# Patient Record
Sex: Male | Born: 1985 | Hispanic: Yes | Marital: Married | Smoking: Never smoker
Health system: Southern US, Community
[De-identification: ages and names within clinical notes are randomized; demographics above are authoritative.]

## PROBLEM LIST (undated history)

## (undated) DIAGNOSIS — K219 Gastro-esophageal reflux disease without esophagitis: Secondary | ICD-10-CM

## (undated) DIAGNOSIS — J45909 Unspecified asthma, uncomplicated: Secondary | ICD-10-CM

## (undated) DIAGNOSIS — G4733 Obstructive sleep apnea (adult) (pediatric): Secondary | ICD-10-CM

## (undated) DIAGNOSIS — T7840XA Allergy, unspecified, initial encounter: Secondary | ICD-10-CM

## (undated) DIAGNOSIS — E669 Obesity, unspecified: Secondary | ICD-10-CM

## (undated) HISTORY — DX: Obesity, unspecified: E66.9

## (undated) HISTORY — DX: Unspecified asthma, uncomplicated: J45.909

## (undated) HISTORY — DX: Obstructive sleep apnea (adult) (pediatric): G47.33

## (undated) HISTORY — DX: Gastro-esophageal reflux disease without esophagitis: K21.9

## (undated) HISTORY — DX: Allergy, unspecified, initial encounter: T78.40XA

---

## 2008-07-20 HISTORY — PX: KNEE ARTHROSCOPY: SHX127

## 2016-04-27 ENCOUNTER — Encounter: Payer: Self-pay | Admitting: Family Medicine

## 2016-04-27 ENCOUNTER — Ambulatory Visit (INDEPENDENT_AMBULATORY_CARE_PROVIDER_SITE_OTHER): Payer: 59 | Admitting: Family Medicine

## 2016-04-27 VITALS — BP 124/88 | HR 83 | Temp 98.3°F | Resp 16 | Ht 63.0 in | Wt 177.4 lb

## 2016-04-27 DIAGNOSIS — Z889 Allergy status to unspecified drugs, medicaments and biological substances status: Secondary | ICD-10-CM

## 2016-04-27 DIAGNOSIS — R0683 Snoring: Secondary | ICD-10-CM

## 2016-04-27 DIAGNOSIS — J329 Chronic sinusitis, unspecified: Secondary | ICD-10-CM | POA: Diagnosis not present

## 2016-04-27 DIAGNOSIS — E6609 Other obesity due to excess calories: Secondary | ICD-10-CM | POA: Diagnosis not present

## 2016-04-27 DIAGNOSIS — E669 Obesity, unspecified: Secondary | ICD-10-CM

## 2016-04-27 DIAGNOSIS — Z6831 Body mass index (BMI) 31.0-31.9, adult: Secondary | ICD-10-CM

## 2016-04-27 HISTORY — DX: Obesity, unspecified: E66.9

## 2016-04-27 NOTE — Progress Notes (Signed)
Pre visit review using our clinic review tool, if applicable. No additional management support is needed unless otherwise documented below in the visit note. 

## 2016-04-27 NOTE — Progress Notes (Signed)
Chief Complaint  Patient presents with  . Establish Care    Pt new to establish care. Has environmental allergies (wants to discuss allergy referral), GERD, frequent sinus infections (nosebleeds 2 x weekly). Has been told that he stops breathing while he sleeps.      New Patient Visit SUBJECTIVE: HPI: Gavin HarmanJuan Green is an 30 y.o.male who is being seen for establishing care.  Concerns: Hx of asthma, allergies, interested in referral to allergist for testing; he takes Allegra and uses Flonase daily.  He reports frequent sinus infection. In the past year, he has had 3, 2 of which required antibiotics. He does not have a hx of recurrent strep throat infections. He states that his tonsils are large and he is concerned they need to be taken out.  He is a known snorer. His mother noticed that he stops breathing in his sleep. He would like to be further evaluated.  Nosebleeds  Hx of recurrent nosebleeds. Gets them when he is poorly hydrated or consumes alcohol. He does not routinely pick his nose or have hx of bleeding disorder. He does   Allergies  Allergen Reactions  . Cephalosporins Rash    Past Medical History:  Diagnosis Date  . Allergy   . Asthma   . GERD (gastroesophageal reflux disease)   . Obesity 04/27/2016   Past Surgical History:  Procedure Laterality Date  . KNEE ARTHROSCOPY Left 2010   Social History   Social History  . Marital status: Married   Social History Main Topics  . Smoking status: Never Smoker  . Smokeless tobacco: Never Used  . Alcohol use 1.2 oz/week    2 Cans of beer per week  . Drug use: No  . Sexual activity: Not on file   Other Topics Concern  . Not on file   Social History Narrative  . No narrative on file   Family History  Problem Relation Age of Onset  . Hyperlipidemia Father   . Heart disease Father   . Hypertension Father   . Hyperlipidemia Maternal Grandmother   . Hyperlipidemia Maternal Grandfather   . Heart disease Maternal  Grandfather   . Diabetes Maternal Grandfather   . Hyperlipidemia Paternal Grandmother   . Stroke Paternal Grandmother   . Diabetes Paternal Grandmother   . Hyperlipidemia Paternal Grandfather   . Heart disease Paternal Grandfather   . Pulmonary embolism Paternal Grandfather   . Diabetes Paternal Grandfather      Current Outpatient Prescriptions:  .  fexofenadine (ALLEGRA) 180 MG tablet, Take 180 mg by mouth daily., Disp: , Rfl:  .  fluticasone (FLONASE) 50 MCG/ACT nasal spray, Place 2 sprays into both nostrils daily., Disp: , Rfl:  .  Naproxen Sodium (ALEVE) 220 MG CAPS, Take 0-2 capsules by mouth once a week., Disp: , Rfl:  .  ranitidine (ZANTAC) 75 MG tablet, Take 75 mg by mouth daily as needed for heartburn., Disp: , Rfl:   ROS HEENT: +nosebleeds  Respiratory: Denies dyspnea   OBJECTIVE: BP 124/88 (BP Location: Left Arm, Cuff Size: Normal)   Pulse 83   Temp 98.3 F (36.8 C) (Oral)   Resp 16   Ht 5\' 3"  (1.6 m)   Wt 177 lb 6.4 oz (80.5 kg)   SpO2 98% Comment: room air  BMI 31.42 kg/m   Constitutional: -  VS reviewed -  Well developed, well nourished, appears stated age -  No apparent distress  Psychiatric: -  Oriented to person, place, and time -  Memory intact -  Affect and mood normal -  Fluent conversation, good eye contact -  Judgment and insight age appropriate  Eye: -  Conjunctivae clear, no discharge -  Pupils symmetric, round, reactive to light  ENMT: -  Ears are patent b/l without erythema or discharge. TM's are shiny and clear b/l without evidence of effusion or infection. -  Oral mucosa without lesions, tongue and uvula midline    Tonsils not enlarged, no erythema, no exudate, trachea midline    Pharynx moist, no lesions, no erythema  Neck: -  No gross swelling, no palpable masses -  Thyroid midline, not enlarged, mobile, no palpable masses  Cardiovascular: -  RRR, no murmurs -  No LE edema  Respiratory: -  Normal respiratory effort, no accessory muscle  use, no retraction -  Breath sounds equal, no wheezes, no ronchi, no crackles  Musculoskeletal: -  No clubbing, no cyanosis -  Gait normal  Skin: -  No significant lesion on inspection -  Warm and dry to palpation   ASSESSMENT/PLAN: Snoring - Plan: Ambulatory referral to Pulmonology  H/O seasonal allergies - Plan: Ambulatory referral to Allergy  Recurrent sinusitis - Plan: Ambulatory referral to ENT  Class 1 obesity due to excess calories without serious comorbidity with body mass index (BMI) of 31.0 to 31.9 in adult - Plan: Comprehensive metabolic panel, Lipid panel  Orders as above. Allergist for allergy testing. ENT for discussion of tonsil removal. I did discuss the [place] criteria with the patient, whose parents are also in the medical field, which he had heard of and stated that the ENT provider may not be willing to do surgery. He can also address nosebleeds though, so it won't be a total waste if surgery is not pursued. May also decide to do CT of sinuses. Ointments and humidifier for nosebleeds. Aim towards same side eye and away from septum with nasal spray. Patient should return in 1 year barring an abnormal lab for a CPE. The patient voiced understanding and agreement to the plan.   Jilda Roche [place], DO 04/27/16  4:25 PM

## 2016-04-27 NOTE — Patient Instructions (Signed)
Nosebleeds: Use an air humidifier. Try to use Vaseline or triple antibiotic ointment up your nose. Be liberal with application.   Claritin (loratadine), Allegra (fexofenadine), Zyrtec (cetirizine); these are listed in order from weakest to strongest. Generic, and therefore cheaper, options are in the parentheses.   Flonase (fluticasone); nasal spray that is over the counter. 2 sprays each nostril, once daily. Aim towards the same side eye when you spray.  There are available OTC, and the generic versions, which may be cheaper, are in parentheses. Show this to a pharmacist if you have trouble finding any of these items.

## 2016-05-07 ENCOUNTER — Encounter: Payer: Self-pay | Admitting: Allergy and Immunology

## 2016-05-07 ENCOUNTER — Ambulatory Visit (INDEPENDENT_AMBULATORY_CARE_PROVIDER_SITE_OTHER): Payer: 59 | Admitting: Allergy and Immunology

## 2016-05-07 VITALS — BP 130/90 | HR 75 | Temp 98.7°F | Resp 16 | Ht 63.5 in | Wt 175.0 lb

## 2016-05-07 DIAGNOSIS — J3089 Other allergic rhinitis: Secondary | ICD-10-CM

## 2016-05-07 DIAGNOSIS — T7800XA Anaphylactic reaction due to unspecified food, initial encounter: Secondary | ICD-10-CM | POA: Insufficient documentation

## 2016-05-07 DIAGNOSIS — J4530 Mild persistent asthma, uncomplicated: Secondary | ICD-10-CM | POA: Insufficient documentation

## 2016-05-07 DIAGNOSIS — L2089 Other atopic dermatitis: Secondary | ICD-10-CM

## 2016-05-07 DIAGNOSIS — H1013 Acute atopic conjunctivitis, bilateral: Secondary | ICD-10-CM

## 2016-05-07 DIAGNOSIS — L2084 Intrinsic (allergic) eczema: Secondary | ICD-10-CM | POA: Insufficient documentation

## 2016-05-07 DIAGNOSIS — L209 Atopic dermatitis, unspecified: Secondary | ICD-10-CM | POA: Insufficient documentation

## 2016-05-07 DIAGNOSIS — H1010 Acute atopic conjunctivitis, unspecified eye: Secondary | ICD-10-CM | POA: Insufficient documentation

## 2016-05-07 MED ORDER — OLOPATADINE HCL 0.7 % OP SOLN: 1.0000 [drp] | Freq: Every day | OPHTHALMIC | 5 refills | Status: DC | PRN

## 2016-05-07 MED ORDER — MONTELUKAST SODIUM 10 MG PO TABS: 10.0000 mg | ORAL_TABLET | Freq: Every day | ORAL | 5 refills | Status: DC

## 2016-05-07 MED ORDER — EPINEPHRINE 0.3 MG/0.3ML IJ SOAJ: 0.3000 mg | Freq: Once | INTRAMUSCULAR | 2 refills | Status: AC

## 2016-05-07 MED ORDER — AZELASTINE-FLUTICASONE 137-50 MCG/ACT NA SUSP: 1.0000 | Freq: Two times a day (BID) | NASAL | 5 refills | Status: DC | PRN

## 2016-05-07 MED ORDER — ALBUTEROL SULFATE 108 (90 BASE) MCG/ACT IN AEPB: 2.0000 | INHALATION_SPRAY | RESPIRATORY_TRACT | 2 refills | Status: DC | PRN

## 2016-05-07 NOTE — Assessment & Plan Note (Signed)
   Appropriate skin care recommendations have been provided verbally and in written form.  A prescription has been provided for triamcinolone 0.1% ointment sparingly to affected areas twice daily as needed below the face and neck. Care is to be taken to avoid the axillae and groin area.  The patient has been asked to make note of any foods that trigger symptom flares.  Fingernails are to be kept trimmed. 

## 2016-05-07 NOTE — Patient Instructions (Addendum)
Perennial and seasonal allergic rhinitis  Aeroallergen avoidance measures have been discussed and provided in written form.  A prescription has been provided for Dymista (azelastine/fluticasone) nasal spray, 1 spray per nostril twice daily as needed. Proper nasal spray technique has been discussed and demonstrated.  I have also recommended nasal saline spray (i.e., Simply Saline) or nasal saline lavage (i.e., NeilMed) as needed prior to medicated nasal sprays.  A prescription has been provided for levocetirizine, 5mg  daily as needed.  The risks and benefits of aeroallergen immunotherapy have been discussed. The patient is motivated to initiate immunotherapy to reduce symptoms and decrease medication requirement. Informed consent has been signed and allergen vaccine orders have been submitted. Medications will be decreased or discontinued as symptom relief from immunotherapy becomes evident.  Allergic conjunctivitis  Treatment plan as outlined above for allergic rhinitis.  A prescription has been provided for Pazeo, one drop per eye daily as needed.  Mild persistent asthma  A prescription has been provided for montelukast 10 mg daily at bedtime.  A prescription has been provided for ProAir Respiclick, 1-2 inhalations every 4-6 hours as needed.  Subjective and objective measures of pulmonary function will be followed and the treatment plan will be adjusted accordingly.  Food allergy The patient's history suggests food allergy and positive skin test results today confirm this diagnosis.  Meticulous avoidance of shellfish (mollusks) as discussed.  A prescription has been provided for epinephrine auto-injector 2 pack along with instructions for proper administration.  A food allergy action plan has been provided and discussed.  Medic Alert identification is recommended.  Atopic dermatitis  Appropriate skin care recommendations have been provided verbally and in written form.  A  prescription has been provided for triamcinolone 0.1% ointment sparingly to affected areas twice daily as needed below the face and neck. Care is to be taken to avoid the axillae and groin area.  The patient has been asked to make note of any foods that trigger symptom flares.  Fingernails are to be kept trimmed.   Return in about 4 months (around 09/07/2016), or if symptoms worsen or fail to improve.  Reducing Pollen Exposure  The American Academy of Allergy, Asthma and Immunology suggests the following steps to reduce your exposure to pollen during allergy seasons.    1. Do not hang sheets or clothing out to dry; pollen may collect on these items. 2. Do not mow lawns or spend time around freshly cut grass; mowing stirs up pollen. 3. Keep windows closed at night.  Keep car windows closed while driving. 4. Minimize morning activities outdoors, a time when pollen counts are usually at their highest. 5. Stay indoors as much as possible when pollen counts or humidity is high and on windy days when pollen tends to remain in the air longer. 6. Use air conditioning when possible.  Many air conditioners have filters that trap the pollen spores. 7. Use a HEPA room air filter to remove pollen form the indoor air you breathe.   Control of House Dust Mite Allergen  House dust mites play a major role in allergic asthma and rhinitis.  They occur in environments with high humidity wherever human skin, the food for dust mites is found. High levels have been detected in dust obtained from mattresses, pillows, carpets, upholstered furniture, bed covers, clothes and soft toys.  The principal allergen of the house dust mite is found in its feces.  A gram of dust may contain 1,000 mites and 250,000 fecal particles.  Mite antigen  is easily measured in the air during house cleaning activities.    1. Encase mattresses, including the box spring, and pillow, in an air tight cover.  Seal the zipper end of the  encased mattresses with wide adhesive tape. 2. Wash the bedding in water of 130 degrees Farenheit weekly.  Avoid cotton comforters/quilts and flannel bedding: the most ideal bed covering is the dacron comforter. 3. Remove all upholstered furniture from the bedroom. 4. Remove carpets, carpet padding, rugs, and non-washable window drapes from the bedroom.  Wash drapes weekly or use plastic window coverings. 5. Remove all non-washable stuffed toys from the bedroom.  Wash stuffed toys weekly. 6. Have the room cleaned frequently with a vacuum cleaner and a damp dust-mop.  The patient should not be in a room which is being cleaned and should wait 1 hour after cleaning before going into the room. 7. Close and seal all heating outlets in the bedroom.  Otherwise, the room will become filled with dust-laden air.  An electric heater can be used to heat the room. Reduce indoor humidity to less than 50%.  Do not use a humidifier.  Control of Dog or Cat Allergen  Avoidance is the best way to manage a dog or cat allergy. If you have a dog or cat and are allergic to dog or cats, consider removing the dog or cat from the home. If you have a dog or cat but don't want to find it a new home, or if your family wants a pet even though someone in the household is allergic, here are some strategies that may help keep symptoms at bay:  1. Keep the pet out of your bedroom and restrict it to only a few rooms. Be advised that keeping the dog or cat in only one room will not limit the allergens to that room. 2. Don't pet, hug or kiss the dog or cat; if you do, wash your hands with soap and water. 3. High-efficiency particulate air (HEPA) cleaners run continuously in a bedroom or living room can reduce allergen levels over time. 4. Regular use of a high-efficiency vacuum cleaner or a central vacuum can reduce allergen levels. 5. Giving your dog or cat a bath at least once a week can reduce airborne allergen.  Control of Mold  Allergen  Mold and fungi can grow on a variety of surfaces provided certain temperature and moisture conditions exist.  Outdoor molds grow on plants, decaying vegetation and soil.  The major outdoor mold, Alternaria and Cladosporium, are found in very high numbers during hot and dry conditions.  Generally, a late Summer - Fall peak is seen for common outdoor fungal spores.  Rain will temporarily lower outdoor mold spore count, but counts rise rapidly when the rainy period ends.  The most important indoor molds are Aspergillus and Penicillium.  Dark, humid and poorly ventilated basements are ideal sites for mold growth.  The next most common sites of mold growth are the bathroom and the kitchen.  Outdoor MicrosoftMold Control 1. Use air conditioning and keep windows closed 2. Avoid exposure to decaying vegetation. 3. Avoid leaf raking. 4. Avoid grain handling. 5. Consider wearing a face mask if working in moldy areas.  Indoor Mold Control 1. Maintain humidity below 50%. 2. Clean washable surfaces with 5% bleach solution. 3. Remove sources e.g. Contaminated carpets.  Control of Cockroach Allergen  Cockroach allergen has been identified as an important cause of acute attacks of asthma, especially in urban settings.  There  are fifty-five species of cockroach that exist in the [place], however only three, the [place], [place] species produce allergen that can affect patients with Asthma.  Allergens can be obtained from fecal particles, egg casings and secretions from cockroaches.    1. Remove food sources. 2. Reduce access to water. 3. Seal access and entry points. 4. Spray runways with 0.5-1% Diazinon or Chlorpyrifos 5. Blow boric acid power under stoves and refrigerator. 6. Place bait stations (hydramethylnon) at feeding sites.   ECZEMA SKIN CARE REGIMEN:  Bathed and soak for 10 minutes in warm water once today. Pat dry.  Immediately apply the below creams: To healthy skin apply  Aquaphor or Vaseline jelly twice a day. To affected areas on the body (below the face and neck), apply: . Triamcinolone 0.1 % ointment twice a day as needed. . With ointments be careful to avoid the armpits and groin area. Note of any foods make the eczema worse. Keep finger nails trimmed and filed.

## 2016-05-07 NOTE — Assessment & Plan Note (Signed)
   A prescription has been provided for montelukast 10 mg daily at bedtime.  A prescription has been provided for ProAir Respiclick, 1-2 inhalations every 4-6 hours as needed.  Subjective and objective measures of pulmonary function will be followed and the treatment plan will be adjusted accordingly.

## 2016-05-07 NOTE — Assessment & Plan Note (Signed)
The patient's history suggests food allergy and positive skin test results today confirm this diagnosis.  Meticulous avoidance of shellfish (mollusks) as discussed.  A prescription has been provided for epinephrine auto-injector 2 pack along with instructions for proper administration.  A food allergy action plan has been provided and discussed.  Medic Alert identification is recommended.

## 2016-05-07 NOTE — Assessment & Plan Note (Signed)
   Treatment plan as outlined above for allergic rhinitis.  A prescription has been provided for Pazeo, one drop per eye daily as needed. 

## 2016-05-07 NOTE — Assessment & Plan Note (Addendum)
   Aeroallergen avoidance measures have been discussed and provided in written form.  A prescription has been provided for Dymista (azelastine/fluticasone) nasal spray, 1 spray per nostril twice daily as needed. Proper nasal spray technique has been discussed and demonstrated.  I have also recommended nasal saline spray (i.e., Simply Saline) or nasal saline lavage (i.e., NeilMed) as needed prior to medicated nasal sprays.  A prescription has been provided for levocetirizine, 5mg  daily as needed.  The risks and benefits of aeroallergen immunotherapy have been discussed. The patient is motivated to initiate immunotherapy to reduce symptoms and decrease medication requirement. Informed consent has been signed and allergen vaccine orders have been submitted. Medications will be decreased or discontinued as symptom relief from immunotherapy becomes evident.

## 2016-05-07 NOTE — Progress Notes (Signed)
New Patient Note  RE: Gavin Green MRN: [id number] DOB: 07-30-1985 Date of Office Visit: 05/07/2016  Referring provider: Sharlene Dory* Primary care provider: Sharlene Dory, DO  Chief Complaint: Asthma and Allergic Rhinitis    History of present illness: Gavin Green is a 30 y.o. male presenting today for consultation of asthma, rhinitis, and possible food allergy.  He was diagnosed with asthma in early childhood.  His asthma symptoms consist of coughing, chest tightness, dyspnea, and wheezing.  His asthma symptoms are triggered by being outdoors, upper respiratory tract infections, cold air, and occasionally exercise.  He has never been hospitalized or intubated and has not required an ER visit for asthma exacerbation in the past 10 years.  He experiences frequent nasal congestion, rhinorrhea, sneezing, nasal pruritus, ocular pruritus, and sinus pressure.  These symptoms occur year around but her most frequent and severe in the springtime in the fall.  Approximately one year ago, he consumed an oyster and developed the sensation of throat tightness.  He has eczema which tends to mainly involve his fingers and hands.  No specific food or environmental triggers have been identified with eczema flares.   Assessment and plan: Perennial and seasonal allergic rhinitis  Aeroallergen avoidance measures have been discussed and provided in written form.  A prescription has been provided for Dymista (azelastine/fluticasone) nasal spray, 1 spray per nostril twice daily as needed. Proper nasal spray technique has been discussed and demonstrated.  I have also recommended nasal saline spray (i.e., Simply Saline) or nasal saline lavage (i.e., NeilMed) as needed prior to medicated nasal sprays.  A prescription has been provided for levocetirizine, 5mg  daily as needed.  The risks and benefits of aeroallergen immunotherapy have been discussed. The patient is motivated to initiate  immunotherapy to reduce symptoms and decrease medication requirement. Informed consent has been signed and allergen vaccine orders have been submitted. Medications will be decreased or discontinued as symptom relief from immunotherapy becomes evident.  Allergic conjunctivitis  Treatment plan as outlined above for allergic rhinitis.  A prescription has been provided for Pazeo, one drop per eye daily as needed.  Mild persistent asthma  A prescription has been provided for montelukast 10 mg daily at bedtime.  A prescription has been provided for ProAir Respiclick, 1-2 inhalations every 4-6 hours as needed.  Subjective and objective measures of pulmonary function will be followed and the treatment plan will be adjusted accordingly.  Food allergy The patient's history suggests food allergy and positive skin test results today confirm this diagnosis.  Meticulous avoidance of shellfish (mollusks) as discussed.  A prescription has been provided for epinephrine auto-injector 2 pack along with instructions for proper administration.  A food allergy action plan has been provided and discussed.  Medic Alert identification is recommended.  Atopic dermatitis  Appropriate skin care recommendations have been provided verbally and in written form.  A prescription has been provided for triamcinolone 0.1% ointment sparingly to affected areas twice daily as needed below the face and neck. Care is to be taken to avoid the axillae and groin area.  The patient has been asked to make note of any foods that trigger symptom flares.  Fingernails are to be kept trimmed.   Meds ordered this encounter  Medications  . EPINEPHrine (EPIPEN 2-PAK) 0.3 mg/0.3 mL IJ SOAJ injection    Sig: Inject 0.3 mLs (0.3 mg total) into the muscle once.    Dispense:  2 Device    Refill:  2    Oh  hold, patient will call  . Albuterol Sulfate (PROAIR RESPICLICK) 108 (90 Base) MCG/ACT AEPB    Sig: Inhale 2 puffs into the  lungs every 4 (four) hours as needed.    Dispense:  1 each    Refill:  2  . montelukast (SINGULAIR) 10 MG tablet    Sig: Take 1 tablet (10 mg total) by mouth at bedtime.    Dispense:  30 tablet    Refill:  5  . Olopatadine HCl (PAZEO) 0.7 % SOLN    Sig: Place 1 drop into both eyes daily as needed.    Dispense:  1 Bottle    Refill:  5  . Azelastine-Fluticasone (DYMISTA) 137-50 MCG/ACT SUSP    Sig: Place 1 spray into both nostrils 2 (two) times daily as needed.    Dispense:  1 Bottle    Refill:  5    Diagnostics: Spirometry: Spirometry reveals an FVC of 4.09 L and an FEV1 of 3.42 L with 130 mL (4%) post bronchodilator improvement.  Please see scanned spirometry results for details. Environmental skin testing: Positive to grass pollen, weed pollen, ragweed pollen, tree pollen, mold, cat hair, dog epithelia, dust mite, cockroach antigen. Food allergen skin testing: Positive to oyster.    Physical examination: Blood pressure 130/90, pulse 75, temperature 98.7 F (37.1 C), temperature source Oral, resp. rate 16, height 5' 3.5" (1.613 m), weight 175 lb (79.4 kg), SpO2 97 %.  General: Alert, interactive, in no acute distress. HEENT: TMs pearly gray, turbinates edematous with clear discharge, post-pharynx moderately erythematous. Neck: Supple without lymphadenopathy. Lungs: Clear to auscultation without wheezing, rhonchi or rales. CV: Normal S1, S2 without murmurs. Abdomen: Nondistended, nontender. Skin: Warm and dry, without lesions or rashes. Extremities:  No clubbing, cyanosis or edema. Neuro:   Grossly intact.  Review of systems:  Review of systems negative except as noted in HPI / PMHx or noted below: Review of Systems  Constitutional: Negative.   HENT: Negative.   Eyes: Negative.   Respiratory: Negative.   Cardiovascular: Negative.   Gastrointestinal: Negative.   Genitourinary: Negative.   Musculoskeletal: Negative.   Skin: Negative.   Neurological: Negative.     Endo/Heme/Allergies: Negative.   Psychiatric/Behavioral: Negative.     Past medical history:  Past Medical History:  Diagnosis Date  . Allergy   . Asthma   . GERD (gastroesophageal reflux disease)   . Obesity 04/27/2016    Past surgical history:  Past Surgical History:  Procedure Laterality Date  . KNEE ARTHROSCOPY Left 2010    Family history: Family History  Problem Relation Age of Onset  . Allergic rhinitis Mother   . Hyperlipidemia Father   . Heart disease Father   . Hypertension Father   . Hyperlipidemia Maternal Grandmother   . Hyperlipidemia Maternal Grandfather   . Heart disease Maternal Grandfather   . Diabetes Maternal Grandfather   . Hyperlipidemia Paternal Grandmother   . Stroke Paternal Grandmother   . Diabetes Paternal Grandmother   . Hyperlipidemia Paternal Grandfather   . Heart disease Paternal Grandfather   . Pulmonary embolism Paternal Grandfather   . Diabetes Paternal Grandfather   . Asthma Paternal Aunt   . Allergic rhinitis Paternal Aunt   . Eczema Neg Hx   . Immunodeficiency Neg Hx   . Urticaria Neg Hx   . Atopy Neg Hx   . Angioedema Neg Hx     Social history: Social History   Social History  . Marital status: Married    Spouse name: N/A  .  Number of children: N/A  . Years of education: N/A   Occupational History  . Not on file.   Social History Main Topics  . Smoking status: Never Smoker  . Smokeless tobacco: Never Used  . Alcohol use 1.2 oz/week    2 Cans of beer per week  . Drug use: No  . Sexual activity: Not on file   Other Topics Concern  . Not on file   Social History Narrative  . No narrative on file   Environmental History: The patient lives in a 29 year old house with hardwood floors, gassy, and central air.  There is A house which does not have access to his bedroom.  He is a nonsmoker but is exposed to fumes, chemicals, and/or dust at his place of work.    Medication List       Accurate as of 05/07/16   6:35 PM. Always use your most recent med list.          albuterol (2.5 MG/3ML) 0.083% nebulizer solution Commonly known as:  PROVENTIL Take 2.5 mg by nebulization every 6 (six) hours as needed for wheezing or shortness of breath.   PROAIR HFA 108 (90 Base) MCG/ACT inhaler Generic drug:  albuterol Inhale into the lungs every 6 (six) hours as needed for wheezing or shortness of breath.   Albuterol Sulfate 108 (90 Base) MCG/ACT Aepb Commonly known as:  PROAIR RESPICLICK Inhale 2 puffs into the lungs every 4 (four) hours as needed.   ALEVE 220 MG Caps Generic drug:  Naproxen Sodium Take 0-2 capsules by mouth once a week.   Azelastine-Fluticasone 137-50 MCG/ACT Susp Commonly known as:  DYMISTA Place 1 spray into both nostrils 2 (two) times daily as needed.   EPINEPHrine 0.3 mg/0.3 mL Soaj injection Commonly known as:  EPIPEN 2-PAK Inject 0.3 mLs (0.3 mg total) into the muscle once.   fexofenadine 180 MG tablet Commonly known as:  ALLEGRA Take 180 mg by mouth daily.   fluticasone 50 MCG/ACT nasal spray Commonly known as:  FLONASE Place 2 sprays into both nostrils daily.   levocetirizine 5 MG tablet Commonly known as:  XYZAL Take 5 mg by mouth every evening.   montelukast 10 MG tablet Commonly known as:  SINGULAIR Take 1 tablet (10 mg total) by mouth at bedtime.   Olopatadine HCl 0.7 % Soln Commonly known as:  PAZEO Place 1 drop into both eyes daily as needed.   ranitidine 75 MG tablet Commonly known as:  ZANTAC Take 75 mg by mouth daily as needed for heartburn.       Known medication allergies: Allergies  Allergen Reactions  . Cephalosporins Rash    I appreciate the opportunity to take part in Yusuf's care. Please do not hesitate to contact me with questions.  Sincerely,   R. Jorene Guest, MD

## 2016-05-08 ENCOUNTER — Other Ambulatory Visit: Payer: Self-pay

## 2016-05-08 MED ORDER — TRIAMCINOLONE ACETONIDE 0.1 % EX OINT: 1.0000 "application " | TOPICAL_OINTMENT | Freq: Two times a day (BID) | CUTANEOUS | 3 refills | Status: DC

## 2016-05-12 NOTE — Progress Notes (Unsigned)
Vials to be made.  05-13-16.  Jm

## 2016-05-14 DIAGNOSIS — J301 Allergic rhinitis due to pollen: Secondary | ICD-10-CM | POA: Diagnosis not present

## 2016-05-15 DIAGNOSIS — J3089 Other allergic rhinitis: Secondary | ICD-10-CM | POA: Diagnosis not present

## 2016-05-21 ENCOUNTER — Ambulatory Visit: Payer: Self-pay

## 2016-05-21 ENCOUNTER — Ambulatory Visit (INDEPENDENT_AMBULATORY_CARE_PROVIDER_SITE_OTHER): Payer: 59

## 2016-05-21 DIAGNOSIS — J309 Allergic rhinitis, unspecified: Secondary | ICD-10-CM

## 2016-05-21 NOTE — Progress Notes (Signed)
Immunotherapy   Patient Details  Name: Gavin HarmanJuan Green MRN: [id number] Date of Birth: 02-23-1986  05/21/2016  Gavin HarmanJuan Green started injections for Blue 1:100,000  (Mold-Cat-Dog-CR & Grass-Weed-Tree-Dmite) Following schedule: A  Frequency:2 times per week Epi-Pen:Epi-Pen Available  Consent signed and patient instructions given.   Virl SonDamita Gainey 05/21/2016, 10:36 AM

## 2016-06-03 ENCOUNTER — Ambulatory Visit (INDEPENDENT_AMBULATORY_CARE_PROVIDER_SITE_OTHER): Payer: 59 | Admitting: *Deleted

## 2016-06-03 ENCOUNTER — Telehealth: Payer: Self-pay | Admitting: Allergy and Immunology

## 2016-06-03 DIAGNOSIS — J309 Allergic rhinitis, unspecified: Secondary | ICD-10-CM

## 2016-06-03 NOTE — Telephone Encounter (Signed)
Gavin MessierKathy can you handle this? Any questions ask Marylene Landngela.

## 2016-06-03 NOTE — Telephone Encounter (Signed)
Pt stated that he has not received anything by mail from us and he was wondering if something is wrong. I verified his mailing address and explained to him that to my knowledge it takes a full billing cycle (approx 1 month) from the date of service before he should receive mail from us. Please call him back to clarify. Thanks!

## 2016-06-03 NOTE — Telephone Encounter (Signed)
Ins pmts just rec'd - he should get statement within the next couple of weeks - left message - kt

## 2016-06-16 ENCOUNTER — Encounter: Payer: Self-pay | Admitting: Medical

## 2016-06-16 ENCOUNTER — Ambulatory Visit (INDEPENDENT_AMBULATORY_CARE_PROVIDER_SITE_OTHER): Payer: 59 | Admitting: Medical

## 2016-06-16 VITALS — BP 126/80 | HR 97 | Temp 98.1°F | Ht 63.5 in | Wt 182.2 lb

## 2016-06-16 DIAGNOSIS — M25532 Pain in left wrist: Secondary | ICD-10-CM | POA: Diagnosis not present

## 2016-06-16 DIAGNOSIS — M79642 Pain in left hand: Secondary | ICD-10-CM | POA: Diagnosis not present

## 2016-06-16 DIAGNOSIS — M79641 Pain in right hand: Secondary | ICD-10-CM

## 2016-06-16 DIAGNOSIS — R040 Epistaxis: Secondary | ICD-10-CM | POA: Insufficient documentation

## 2016-06-16 DIAGNOSIS — M25531 Pain in right wrist: Secondary | ICD-10-CM

## 2016-06-16 LAB — COMPREHENSIVE METABOLIC PANEL
ALBUMIN: 4.3 g/dL (ref 3.5–5.2)
ALK PHOS: 70 U/L (ref 39–117)
ALT: 22 U/L (ref 0–53)
AST: 18 U/L (ref 0–37)
BUN: 15 mg/dL (ref 6–23)
CO2: 26 mEq/L (ref 19–32)
Calcium: 9.3 mg/dL (ref 8.4–10.5)
Chloride: 105 mEq/L (ref 96–112)
Creatinine, Ser: 0.62 mg/dL (ref 0.40–1.50)
GFR: 163.09 mL/min (ref >60.00)
Glucose, Bld: 93 mg/dL (ref 70–99)
POTASSIUM: 4.2 meq/L (ref 3.5–5.1)
Sodium: 138 mEq/L (ref 135–145)
TOTAL PROTEIN: 7.3 g/dL (ref 6.0–8.3)
Total Bilirubin: 0.4 mg/dL (ref 0.2–1.2)

## 2016-06-16 LAB — LIPID PANEL
CHOLESTEROL: 197 mg/dL (ref 0–200)
HDL: 49.8 mg/dL (ref >39.00)
LDL Cholesterol: 124 mg/dL — ABNORMAL HIGH (ref 0–99)
NonHDL: 146.75
Total CHOL/HDL Ratio: 4
Triglycerides: 112 mg/dL (ref 0.0–149.0)
VLDL: 22.4 mg/dL (ref 0.0–40.0)

## 2016-06-16 MED ORDER — TRAMADOL HCL 50 MG PO TABS: ORAL_TABLET | ORAL | 0 refills | Status: DC

## 2016-06-16 MED ORDER — DICLOFENAC SODIUM 75 MG PO TBEC: 75.0000 mg | DELAYED_RELEASE_TABLET | Freq: Two times a day (BID) | ORAL | 0 refills | Status: DC

## 2016-06-16 NOTE — Patient Instructions (Addendum)
For your hand and wrist discomfort will go ahead and refer to orthopedist and get their opinion if carpel tunnel variant. If they don't think this is the case let me know and might at that point refer to neurologist. But orthopedist would be the quicker referral.   Will rx diclofenac for pain and inflammation.  Will prescribe low number of tramadol to use at night if pain preventing you from sleeping.  Follow up with us as needed before or after ortho referral.  Victorino DikeJennifer or Silva BandyKristi are our referral staff and you can call them for update on the referral.

## 2016-06-16 NOTE — Progress Notes (Addendum)
Subjective:    Patient ID: Gavin Green, male    DOB: 1985-08-02, 30 y.o.   MRN: [id number]  HPI  Pt in with some hx of some rt hand and wrist tingling that started about 3 weeks ago. Pt states he tried to stop 12 sheets of masonite from falling off a cart. He held [employer] of boards for about 5 seconds. Since then he has tingling and pain. Pt got a splint over the counter. But he states not helping much. Pt is rt handed. Pt states the morning after is when symptoms started.   Pt states recently pain can't sleep.  Pt states lying down increase symptoms very fast. But not neck pain.    Review of Systems  Constitutional: Negative for chills, fatigue and fever.  Respiratory: Negative for cough, chest tightness, shortness of breath and wheezing.   Cardiovascular: Negative for chest pain and palpitations.  Musculoskeletal: Negative for neck pain.       Rt hand and wrist pain after numbness is moderate to severe.  No radicular type pain.  Neurological:       Rt hand some tingling and numbness with some pain to both hands and wrist. Rt side is worse.  Symptoms worse lying down. No neck pain.  Hematological: Negative for adenopathy. Does not bruise/bleed easily.  Psychiatric/Behavioral: Negative for behavioral problems and confusion.     Past Medical History:  Diagnosis Date  . Allergy   . Asthma   . GERD (gastroesophageal reflux disease)   . Obesity 04/27/2016     Social History   Social History  . Marital status: Married    Spouse name: N/A  . Number of children: N/A  . Years of education: N/A   Occupational History  . Not on file.   Social History Main Topics  . Smoking status: Never Smoker  . Smokeless tobacco: Never Used  . Alcohol use 1.2 oz/week    2 Cans of beer per week  . Drug use: No  . Sexual activity: Not on file   Other Topics Concern  . Not on file   Social History Narrative  . No narrative on file    Past Surgical History:  Procedure Laterality  Date  . KNEE ARTHROSCOPY Left 2010    Family History  Problem Relation Age of Onset  . Allergic rhinitis Mother   . Hyperlipidemia Father   . Heart disease Father   . Hypertension Father   . Hyperlipidemia Maternal Grandmother   . Hyperlipidemia Maternal Grandfather   . Heart disease Maternal Grandfather   . Diabetes Maternal Grandfather   . Hyperlipidemia Paternal Grandmother   . Stroke Paternal Grandmother   . Diabetes Paternal Grandmother   . Hyperlipidemia Paternal Grandfather   . Heart disease Paternal Grandfather   . Pulmonary embolism Paternal Grandfather   . Diabetes Paternal Grandfather   . Asthma Paternal Aunt   . Allergic rhinitis Paternal Aunt   . Eczema Neg Hx   . Immunodeficiency Neg Hx   . Urticaria Neg Hx   . Atopy Neg Hx   . Angioedema Neg Hx     Allergies  Allergen Reactions  . Cephalosporins Rash  . Oysters [Shellfish Allergy] Other (See Comments)    Allergy    Current Outpatient Prescriptions on File Prior to Visit  Medication Sig Dispense Refill  . albuterol (PROAIR HFA) 108 (90 Base) MCG/ACT inhaler Inhale into the lungs every 6 (six) hours as needed for wheezing or shortness of breath.    .Marland Kitchen  albuterol (PROVENTIL) (2.5 MG/3ML) 0.083% nebulizer solution Take 2.5 mg by nebulization every 6 (six) hours as needed for wheezing or shortness of breath.    . Albuterol Sulfate (PROAIR RESPICLICK) 108 (90 Base) MCG/ACT AEPB Inhale 2 puffs into the lungs every 4 (four) hours as needed. 1 each 2  . Azelastine-Fluticasone (DYMISTA) 137-50 MCG/ACT SUSP Place 1 spray into both nostrils 2 (two) times daily as needed. 1 Bottle 5  . fexofenadine (ALLEGRA) 180 MG tablet Take 180 mg by mouth daily.    . fluticasone (FLONASE) 50 MCG/ACT nasal spray Place 2 sprays into both nostrils daily.    Marland Kitchen. levocetirizine (XYZAL) 5 MG tablet Take 5 mg by mouth every evening.    . montelukast (SINGULAIR) 10 MG tablet Take 1 tablet (10 mg total) by mouth at bedtime. 30 tablet 5  .  Naproxen Sodium (ALEVE) 220 MG CAPS Take 0-2 capsules by mouth once a week.    . Olopatadine HCl (PAZEO) 0.7 % SOLN Place 1 drop into both eyes daily as needed. 1 Bottle 5  . ranitidine (ZANTAC) 75 MG tablet Take 75 mg by mouth daily as needed for heartburn.    . triamcinolone ointment (KENALOG) 0.1 % Apply 1 application topically 2 (two) times daily. Apply to areas below the face sparingly. 45 g 3   No current facility-administered medications on file prior to visit.     BP 126/80 (BP Location: Left Arm, Patient Position: Sitting)   Pulse 97   Temp 98.1 F (36.7 C) (Oral)   Ht 5' 3.5" (1.613 m)   Wt 182 lb 3.2 oz (82.6 kg)   SpO2 98%   BMI 31.77 kg/m       Objective:   Physical Exam  General- No acute distress. Pleasant patient. Neck- Full range of motion, no jvd Lungs- Clear, even and unlabored. Heart- regular rate and rhythm. Neurologic- CNII- XII grossly intact.  Rt wrist- On phalens testing he states relieves the symptoms relatively quickly.  Lt wrist- faint + phalens signs.       Assessment & Plan:  For your hand and wrist discomfort will go ahead and refer to orthopedist and get their opinion if carpel tunnel variant. If they don't think this is the case let me know and might at that point refer to neurologist. But orthopedist would be the quicker referral.   Will rx diclofenac for pain and inflammation.  Will prescribe low number of tramadol to use at night if pain preventing you from sleeping.  Follow up with us as needed before or after ortho referral.

## 2016-06-16 NOTE — Progress Notes (Signed)
Pre visit review using our clinic review tool, if applicable. No additional management support is needed unless otherwise documented below in the visit note. 

## 2016-06-18 ENCOUNTER — Ambulatory Visit: Payer: 59 | Admitting: Family Medicine

## 2016-06-18 ENCOUNTER — Ambulatory Visit (INDEPENDENT_AMBULATORY_CARE_PROVIDER_SITE_OTHER): Payer: 59

## 2016-06-18 DIAGNOSIS — J309 Allergic rhinitis, unspecified: Secondary | ICD-10-CM

## 2016-06-23 ENCOUNTER — Ambulatory Visit (INDEPENDENT_AMBULATORY_CARE_PROVIDER_SITE_OTHER): Payer: 59

## 2016-06-23 DIAGNOSIS — J309 Allergic rhinitis, unspecified: Secondary | ICD-10-CM

## 2016-06-24 ENCOUNTER — Ambulatory Visit (INDEPENDENT_AMBULATORY_CARE_PROVIDER_SITE_OTHER): Payer: 59 | Admitting: Orthopaedic Surgery

## 2016-06-24 DIAGNOSIS — M25531 Pain in right wrist: Secondary | ICD-10-CM

## 2016-06-24 MED ORDER — METHYLPREDNISOLONE 4 MG PO TABS: ORAL_TABLET | ORAL | 0 refills | Status: DC

## 2016-06-24 NOTE — Progress Notes (Signed)
Office Visit Note   Patient: Gavin Green           Date of Birth: 21-Sep-1985           MRN: [id number] Visit Date: 06/24/2016              Requested by: Gavin DoryNicholas Paul Wendling, DO [address] [place], [place] [id number] PCP: Gavin DoryNicholas Paul Wendling, DO   Assessment & Plan: Visit Diagnoses:  1. Pain in right wrist     Plan: I do think that he did have a stretch type of injury to the median nerve on both sides worsen the right and left dehiscence associated tendinitis in the forearm on the right more so than left. He is slowly improving. I will put him on a six-day steroid taper to see if that helps. We'll see him back in about 4 weeks to see how is doing overall. Certainly if things are worsening anywhere not getting better we can consider nerve conduction studies however I do feel these will probably make significant improvement with time.  Follow-Up Instructions: Return in about 4 weeks (around 07/22/2016).   Orders:  No orders of the defined types were placed in this encounter.  Meds ordered this encounter  Medications  . methylPREDNISolone (MEDROL) 4 MG tablet    Sig: Medrol dose pack. Take as instructed    Dispense:  21 tablet    Refill:  0      Procedures: No procedures performed   Clinical Data: No additional findings.   Subjective: Chief Complaint  Patient presents with  . Left Hand - Pain    Patient has bilateral wrist pain that mimics CTS, but actually had injury catching plywood that almost fell on someone, and held that heavy load. N&T/pain in hands. Wearing wirst braces.  . 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 His pain and numbness and tingling is almost mimicking carpal tunnel syndrome with way he describes his injury. He was catching some heavy planks avoid from falling and a put strain on both his wrists. He actually got persistent numbness is  waking up at night and mainly the middle and ring fingers on both sides and also some forearm and wrist pain and some slight clicking. He was placed on diclofenac and he slowly wean himself from this and his symptoms have not gone away completely but they have improved. He is getting more comfortable rest at night. He is wearing bilateral wrist splints and that is helped some. Review of Systems  He denies any chest pain, headache, shortness of breath, fever, chills, nausea, vomiting. Objective: Vital Signs: There were no vitals taken for this visit.  Physical Exam He is alert and oriented 3 and in no acute distress Ortho Exam Examination of both his wrists show no obvious bruising with 4 range of motion but there are painful with dorsiflexion of both wrist is little bit of pain to palpation around the forearm but not a specific type of pain. There is no triggering of any of his fingers. He has negative Phalen's and Tinel sign at the carpal tunnel he's got excellent grip strength bilaterally she'll pinch strength bilaterally Specialty Comments:  No specialty comments available.  Imaging: No results found.   PMFS History: Patient Active Problem List   Diagnosis Date Noted  . Perennial and seasonal allergic rhinitis  05/07/2016  . Allergic conjunctivitis 05/07/2016  . Mild persistent asthma 05/07/2016  . Food allergy 05/07/2016  . Atopic dermatitis 05/07/2016  . Obesity 04/27/2016   Past Medical History:  Diagnosis Date  . Allergy   . Asthma   . GERD (gastroesophageal reflux disease)   . Obesity 04/27/2016    Family History  Problem Relation Age of Onset  . Allergic rhinitis Mother   . Hyperlipidemia Father   . Heart disease Father   . Hypertension Father   . Hyperlipidemia Maternal Grandmother   . Hyperlipidemia Maternal Grandfather   . Heart disease Maternal Grandfather   . Diabetes Maternal Grandfather   . Hyperlipidemia Paternal Grandmother   . Stroke Paternal  Grandmother   . Diabetes Paternal Grandmother   . Hyperlipidemia Paternal Grandfather   . Heart disease Paternal Grandfather   . Pulmonary embolism Paternal Grandfather   . Diabetes Paternal Grandfather   . Asthma Paternal Aunt   . Allergic rhinitis Paternal Aunt   . Eczema Neg Hx   . Immunodeficiency Neg Hx   . Urticaria Neg Hx   . Atopy Neg Hx   . Angioedema Neg Hx     Past Surgical History:  Procedure Laterality Date  . KNEE ARTHROSCOPY Left 2010   Social History   Occupational History  . Not on file.   Social History Main Topics  . Smoking status: Never Smoker  . Smokeless tobacco: Never Used  . Alcohol use 1.2 oz/week    2 Cans of beer per week  . Drug use: No  . Sexual activity: Not on file

## 2016-07-08 ENCOUNTER — Ambulatory Visit (INDEPENDENT_AMBULATORY_CARE_PROVIDER_SITE_OTHER): Payer: 59 | Admitting: *Deleted

## 2016-07-08 DIAGNOSIS — J309 Allergic rhinitis, unspecified: Secondary | ICD-10-CM

## 2016-07-17 ENCOUNTER — Other Ambulatory Visit: Payer: Self-pay

## 2016-07-17 MED ORDER — EPINEPHRINE 0.3 MG/0.3ML IJ SOAJ: INTRAMUSCULAR | 1 refills | Status: DC

## 2016-07-23 ENCOUNTER — Ambulatory Visit (INDEPENDENT_AMBULATORY_CARE_PROVIDER_SITE_OTHER): Payer: 59

## 2016-07-23 ENCOUNTER — Ambulatory Visit (INDEPENDENT_AMBULATORY_CARE_PROVIDER_SITE_OTHER): Payer: 59 | Admitting: Orthopaedic Surgery

## 2016-07-23 DIAGNOSIS — M654 Radial styloid tenosynovitis [de Quervain]: Secondary | ICD-10-CM

## 2016-07-23 DIAGNOSIS — J309 Allergic rhinitis, unspecified: Secondary | ICD-10-CM | POA: Diagnosis not present

## 2016-07-23 NOTE — Progress Notes (Signed)
The patient is following up with bilateral wrist pain. 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 The put him on a steroid taper he said his almost completely better but not 100% but much better overall. Is worse on left than the right. He does work on things that involve a lot of fine motor skills.  On examination of his wrist he does have pain over the first dorsal compartment of both wrists radiating into the forearm his motor sensory exam is otherwise normal. His Lourena SimmondsFinkelstein test is mildly positive on the left than on the right.  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 He is doing well overall and she continued to do well with time and activity modification. I did show him some Internet sites the deal with treatment exercises for de Quervain's. He'll follow up as needed.

## 2016-07-29 ENCOUNTER — Ambulatory Visit (INDEPENDENT_AMBULATORY_CARE_PROVIDER_SITE_OTHER): Payer: 59 | Admitting: *Deleted

## 2016-07-29 DIAGNOSIS — J309 Allergic rhinitis, unspecified: Secondary | ICD-10-CM

## 2016-08-04 ENCOUNTER — Ambulatory Visit (INDEPENDENT_AMBULATORY_CARE_PROVIDER_SITE_OTHER): Payer: 59

## 2016-08-04 DIAGNOSIS — J309 Allergic rhinitis, unspecified: Secondary | ICD-10-CM | POA: Diagnosis not present

## 2016-08-12 ENCOUNTER — Ambulatory Visit (INDEPENDENT_AMBULATORY_CARE_PROVIDER_SITE_OTHER): Payer: 59

## 2016-08-12 DIAGNOSIS — J309 Allergic rhinitis, unspecified: Secondary | ICD-10-CM | POA: Diagnosis not present

## 2016-08-16 ENCOUNTER — Encounter (HOSPITAL_BASED_OUTPATIENT_CLINIC_OR_DEPARTMENT_OTHER): Payer: Self-pay | Admitting: Emergency Medicine

## 2016-08-16 ENCOUNTER — Emergency Department (HOSPITAL_BASED_OUTPATIENT_CLINIC_OR_DEPARTMENT_OTHER)
Admission: EM | Admit: 2016-08-16 | Discharge: 2016-08-17 | Disposition: A | Discharge status: Home / Self Care | Payer: 59 | Attending: Emergency Medicine | Admitting: Emergency Medicine

## 2016-08-16 ENCOUNTER — Emergency Department (HOSPITAL_BASED_OUTPATIENT_CLINIC_OR_DEPARTMENT_OTHER): Payer: 59

## 2016-08-16 DIAGNOSIS — R1031 Right lower quadrant pain: Secondary | ICD-10-CM

## 2016-08-16 DIAGNOSIS — R109 Unspecified abdominal pain: Secondary | ICD-10-CM | POA: Diagnosis not present

## 2016-08-16 DIAGNOSIS — K529 Noninfective gastroenteritis and colitis, unspecified: Secondary | ICD-10-CM | POA: Diagnosis not present

## 2016-08-16 DIAGNOSIS — Z79899 Other long term (current) drug therapy: Secondary | ICD-10-CM | POA: Insufficient documentation

## 2016-08-16 DIAGNOSIS — J45909 Unspecified asthma, uncomplicated: Secondary | ICD-10-CM | POA: Insufficient documentation

## 2016-08-16 LAB — CBC WITH DIFFERENTIAL/PLATELET
Basophils Absolute: 0 10*3/uL (ref 0.0–0.1)
Basophils Relative: 0 %
EOS ABS: 0 10*3/uL (ref 0.0–0.7)
Eosinophils Relative: 0 %
HEMATOCRIT: 43.1 % (ref 39.0–52.0)
HEMOGLOBIN: 14.6 g/dL (ref 13.0–17.0)
LYMPHS ABS: 0.5 10*3/uL — AB (ref 0.7–4.0)
LYMPHS PCT: 5 %
MCH: 28.9 pg (ref 26.0–34.0)
MCHC: 33.9 g/dL (ref 30.0–36.0)
MCV: 85.2 fL (ref 78.0–100.0)
MONOS PCT: 4 %
Monocytes Absolute: 0.4 10*3/uL (ref 0.1–1.0)
NEUTROS ABS: 9.5 10*3/uL — AB (ref 1.7–7.7)
NEUTROS PCT: 91 %
Platelets: 309 10*3/uL (ref 150–400)
RBC: 5.06 MIL/uL (ref 4.22–5.81)
RDW: 13.7 % (ref 11.5–15.5)
WBC: 10.5 10*3/uL (ref 4.0–10.5)

## 2016-08-16 LAB — COMPREHENSIVE METABOLIC PANEL
ALK PHOS: 72 U/L (ref 38–126)
ALT: 24 U/L (ref 17–63)
ANION GAP: 9 (ref 5–15)
AST: 22 U/L (ref 15–41)
Albumin: 4.3 g/dL (ref 3.5–5.0)
BILIRUBIN TOTAL: 0.9 mg/dL (ref 0.3–1.2)
BUN: 12 mg/dL (ref 6–20)
CALCIUM: 8.8 mg/dL — AB (ref 8.9–10.3)
CO2: 25 mmol/L (ref 22–32)
CREATININE: 0.67 mg/dL (ref 0.61–1.24)
Chloride: 102 mmol/L (ref 101–111)
Glucose, Bld: 103 mg/dL — ABNORMAL HIGH (ref 65–99)
Potassium: 3.6 mmol/L (ref 3.5–5.1)
Sodium: 136 mmol/L (ref 135–145)
TOTAL PROTEIN: 7.6 g/dL (ref 6.5–8.1)

## 2016-08-16 LAB — I-STAT CG4 LACTIC ACID, ED: LACTIC ACID, VENOUS: 1.01 mmol/L (ref 0.5–1.9)

## 2016-08-16 LAB — URINALYSIS, ROUTINE W REFLEX MICROSCOPIC
BILIRUBIN URINE: NEGATIVE
Glucose, UA: NEGATIVE mg/dL
HGB URINE DIPSTICK: NEGATIVE
KETONES UR: NEGATIVE mg/dL
Leukocytes, UA: NEGATIVE
NITRITE: NEGATIVE
PH: 7 (ref 5.0–8.0)
Protein, ur: NEGATIVE mg/dL
Specific Gravity, Urine: 1.023 (ref 1.005–1.030)

## 2016-08-16 LAB — LIPASE, BLOOD: Lipase: 23 U/L (ref 11–51)

## 2016-08-16 MED ORDER — ACETAMINOPHEN 500 MG PO TABS
1000.0000 mg | ORAL_TABLET | Freq: Once | ORAL | Status: AC
  Administered 2016-08-16: 1000 mg via ORAL
  Filled 2016-08-16: qty 2

## 2016-08-16 MED ORDER — IOPAMIDOL (ISOVUE-300) INJECTION 61%
100.0000 mL | Freq: Once | INTRAVENOUS | Status: AC | PRN
  Administered 2016-08-16: 100 mL via INTRAVENOUS

## 2016-08-16 MED ORDER — SODIUM CHLORIDE 0.9 % IV BOLUS (SEPSIS)
1000.0000 mL | Freq: Once | INTRAVENOUS | Status: AC
  Administered 2016-08-16: 1000 mL via INTRAVENOUS

## 2016-08-16 MED ORDER — PIPERACILLIN-TAZOBACTAM 3.375 G IVPB 30 MIN
3.3750 g | Freq: Once | INTRAVENOUS | Status: AC
  Administered 2016-08-16: 3.375 g via INTRAVENOUS
  Filled 2016-08-16 (×2): qty 50

## 2016-08-16 MED ORDER — ONDANSETRON HCL 4 MG/2ML IJ SOLN
4.0000 mg | Freq: Once | INTRAMUSCULAR | Status: AC
  Administered 2016-08-16: 4 mg via INTRAVENOUS
  Filled 2016-08-16: qty 2

## 2016-08-16 MED ORDER — KETOROLAC TROMETHAMINE 15 MG/ML IJ SOLN
30.0000 mg | Freq: Once | INTRAMUSCULAR | Status: AC
  Administered 2016-08-16: 30 mg via INTRAVENOUS
  Filled 2016-08-16: qty 2

## 2016-08-16 NOTE — ED Notes (Signed)
Pt alert, NAD, calm, interactive, resps e/u, speaking in clear complete sentences, VSS, no dyspnea noted, skin warm and moist, using urinal, IVF w.o.

## 2016-08-16 NOTE — ED Notes (Signed)
Patient returned from xray.

## 2016-08-16 NOTE — ED Triage Notes (Signed)
Patient has right sided abdominal pain with fever and Nausea with vomiting  1 time this am

## 2016-08-16 NOTE — ED Notes (Signed)
Last PO intake at @1100 .

## 2016-08-16 NOTE — ED Provider Notes (Signed)
[facility] Provider Note   CSN: [id number] Arrival date & time: 08/16/16  1825  By signing my name below, I, Modena Jansky, attest that this documentation has been prepared under the direction and in the presence of Alvira Monday, MD. Electronically Signed: Modena Jansky, Scribe. 08/16/2016. 6:50 PM.  History   Chief Complaint Chief Complaint  Patient presents with  . Abdominal Pain   The history is provided by the patient. No language interpreter was used.   HPI Comments: Gavin Green is a 31 y.o. male who presents to the Emergency Department complaining of constant moderate right-sided abdominal pain that started this morning. He states woke up feeling normal then had a sudden onset of symptoms. His pain is exacerbated by movement. He reports associated fever (Tmax:101.3), vomiting (one episode), and diarrhea. Decreased appetite. He admits to a cephalosporin allergy (full-body rash reaction). He denies any recent antibiotic use, sick contacts, blood in stool, cough, body aches or rhinorrhea.     PCP: Sharlene Dory, DO  Past Medical History:  Diagnosis Date  . Allergy   . Asthma   . GERD (gastroesophageal reflux disease)   . Obesity 04/27/2016    Patient Active Problem List   Diagnosis Date Noted  . Perennial and seasonal allergic rhinitis 05/07/2016  . Allergic conjunctivitis 05/07/2016  . Mild persistent asthma 05/07/2016  . Food allergy 05/07/2016  . Atopic dermatitis 05/07/2016  . Obesity 04/27/2016    Past Surgical History:  Procedure Laterality Date  . KNEE ARTHROSCOPY Left 2010       Home Medications    Prior to Admission medications   Medication Sig Start Date End Date Taking? Authorizing Provider  albuterol (PROAIR HFA) 108 (90 Base) MCG/ACT inhaler Inhale into the lungs every 6 (six) hours as needed for wheezing or shortness of breath.    Historical Provider, MD  albuterol (PROVENTIL) (2.5 MG/3ML) 0.083% nebulizer solution Take 2.5  mg by nebulization every 6 (six) hours as needed for wheezing or shortness of breath.    Historical Provider, MD  Albuterol Sulfate (PROAIR RESPICLICK) 108 (90 Base) MCG/ACT AEPB Inhale 2 puffs into the lungs every 4 (four) hours as needed. 05/07/16   Cristal Ford, MD  Azelastine-Fluticasone [facility]) 137-50 MCG/ACT SUSP Place 1 spray into both nostrils 2 (two) times daily as needed. 05/07/16   Cristal Ford, MD  diclofenac (VOLTAREN) 75 MG EC tablet Take 1 tablet (75 mg total) by mouth 2 (two) times daily. 06/16/16   Ramon Dredge Saguier, PA-C  dicyclomine (BENTYL) 20 MG tablet Take 1 tablet (20 mg total) by mouth 2 (two) times daily. 08/17/16   Alvira Monday, MD  EPINEPHrine (AUVI-Q) 0.3 mg/0.3 mL IJ SOAJ injection Use as directed for severe allergic reaction 07/17/16   Cristal Ford, MD  fexofenadine (ALLEGRA) 180 MG tablet Take 180 mg by mouth daily.    Historical Provider, MD  fluticasone (FLONASE) 50 MCG/ACT nasal spray Place 2 sprays into both nostrils daily.    Historical Provider, MD  levocetirizine (XYZAL) 5 MG tablet Take 5 mg by mouth every evening.    Historical Provider, MD  methylPREDNISolone (MEDROL) 4 MG tablet Medrol dose pack. Take as instructed Patient not taking: Reported on 07/23/2016 06/24/16   Kathryne Hitch, MD  montelukast (SINGULAIR) 10 MG tablet Take 1 tablet (10 mg total) by mouth at bedtime. 05/07/16   Cristal Ford, MD  Olopatadine HCl (PAZEO) 0.7 % SOLN Place 1 drop into both eyes daily as needed. 05/07/16  Cristal Ford, MD  ondansetron (ZOFRAN ODT) 4 MG disintegrating tablet Take 1 tablet (4 mg total) by mouth every 8 (eight) hours as needed for nausea or vomiting. 08/17/16   Alvira Monday, MD  ranitidine (ZANTAC) 75 MG tablet Take 75 mg by mouth daily as needed for heartburn.    Historical Provider, MD  traMADol (ULTRAM) 50 MG tablet 1 tab po q hs for severe pain. 06/16/16   Ramon Dredge Saguier, PA-C  triamcinolone ointment (KENALOG)  0.1 % Apply 1 application topically 2 (two) times daily. Apply to areas below the face sparingly. 05/08/16   Cristal Ford, MD    Family History Family History  Problem Relation Age of Onset  . Allergic rhinitis Mother   . Hyperlipidemia Father   . Heart disease Father   . Hypertension Father   . Hyperlipidemia Maternal Grandmother   . Hyperlipidemia Maternal Grandfather   . Heart disease Maternal Grandfather   . Diabetes Maternal Grandfather   . Hyperlipidemia Paternal Grandmother   . Stroke Paternal Grandmother   . Diabetes Paternal Grandmother   . Hyperlipidemia Paternal Grandfather   . Heart disease Paternal Grandfather   . Pulmonary embolism Paternal Grandfather   . Diabetes Paternal Grandfather   . Asthma Paternal Aunt   . Allergic rhinitis Paternal Aunt   . Eczema Neg Hx   . Immunodeficiency Neg Hx   . Urticaria Neg Hx   . Atopy Neg Hx   . Angioedema Neg Hx     Social History Social History  Substance Use Topics  . Smoking status: Never Smoker  . Smokeless tobacco: Never Used  . Alcohol use 1.2 oz/week    2 Cans of beer per week     Allergies   Cephalosporins and Oysters [shellfish allergy]   Review of Systems Review of Systems  Constitutional: Positive for fever (Tmax: 101.3).  HENT: Negative for rhinorrhea and sore throat.   Eyes: Negative for visual disturbance.  Respiratory: Negative for cough and shortness of breath.   Cardiovascular: Negative for chest pain.  Gastrointestinal: Positive for abdominal pain, diarrhea and vomiting (One episode). Negative for blood in stool.  Genitourinary: Negative for difficulty urinating.  Musculoskeletal: Negative for back pain and neck stiffness.  Skin: Negative for rash.  Neurological: Negative for syncope and headaches.     Physical Exam Updated Vital Signs BP 107/73 (BP Location: Right Arm)   Pulse (!) 139   Temp 99.9 F (37.7 C) (Oral)   Resp 18   Ht 5\' 3"  (1.6 m)   Wt 178 lb (80.7 kg)   SpO2  100%   BMI 31.53 kg/m   Physical Exam  Constitutional: He is oriented to person, place, and time. He appears well-developed and well-nourished. No distress.  HENT:  Head: Normocephalic and atraumatic.  Eyes: Conjunctivae are normal.  Neck: Neck supple.  Cardiovascular: Regular rhythm and intact distal pulses.  Tachycardia present.  Exam reveals no gallop and no friction rub.   No murmur heard. Tachycardia.   Pulmonary/Chest: Effort normal. No respiratory distress. He has no wheezes. He has no rales.  Abdominal: Soft. There is tenderness. There is rebound. There is no guarding.  RLQ TTP with rebound. No guarding.   Musculoskeletal: Normal range of motion.  Neurological: He is alert and oriented to person, place, and time.  Skin: Skin is warm and dry.  Psychiatric: He has a normal mood and affect.  Nursing note and vitals reviewed.    ED Treatments / Results  DIAGNOSTIC STUDIES: Oxygen  Saturation is 100% on RA, normal by my interpretation.    COORDINATION OF CARE: 6:55 PM- Pt advised of plan for treatment and pt agrees.  Labs (all labs ordered are listed, but only abnormal results are displayed) Labs Reviewed  CBC WITH DIFFERENTIAL/PLATELET - Abnormal; Notable for the following:       Result Value   Neutro Abs 9.5 (*)    Lymphs Abs 0.5 (*)    All other components within normal limits  COMPREHENSIVE METABOLIC PANEL - Abnormal; Notable for the following:    Glucose, Bld 103 (*)    Calcium 8.8 (*)    All other components within normal limits  CULTURE, BLOOD (ROUTINE X 2)  CULTURE, BLOOD (ROUTINE X 2)  URINE CULTURE  LIPASE, BLOOD  URINALYSIS, ROUTINE W REFLEX MICROSCOPIC  TROPONIN I  I-STAT CG4 LACTIC ACID, ED    EKG  EKG Interpretation  Date/Time:  Monday August 17 2016 00:08:13 EST Ventricular Rate:  108 PR Interval:    QRS Duration: 103 QT Interval:  321 QTC Calculation: 431 R Axis:   -8 Text Interpretation:  Sinus tachycardia No previous ECGs available  Confirmed by [facility] Center[facility] MD, Denny PeonERIN ([id number]) on 08/17/2016 12:32:26 AM Also confirmed by [facility], Lashawnda Hancox ([id number]), editor [place]  CCT, BEVERLY ([id number])  on 08/17/2016 6:49:54 AM       Radiology Ct Abdomen Pelvis W Contrast  Result Date: 08/16/2016 CLINICAL DATA:  Right-sided abdominal pain and fever. Onset this morning. EXAM: CT ABDOMEN AND PELVIS WITH CONTRAST TECHNIQUE: Multidetector CT imaging of the abdomen and pelvis was performed using the standard protocol following bolus administration of intravenous contrast. CONTRAST:  100mL ISOVUE-300 IOPAMIDOL (ISOVUE-300) INJECTION 61% COMPARISON:  None. FINDINGS: Lower chest: No acute abnormality. Hepatobiliary: No focal liver abnormality is seen. No gallstones, gallbladder wall thickening, or biliary dilatation. Pancreas: Unremarkable. No pancreatic ductal dilatation or surrounding inflammatory changes. Spleen: Normal in size without focal abnormality. Adrenals/Urinary Tract: Adrenal glands are unremarkable. Kidneys are normal, without renal calculi, focal lesion, or hydronephrosis. Bladder is unremarkable. Stomach/Bowel: Stomach is within normal limits. Appendix is normal. No evidence of bowel wall thickening, distention, or inflammatory changes. Vascular/Lymphatic: No significant vascular findings are present. No enlarged abdominal or pelvic lymph nodes. Reproductive: Unremarkable Other: No acute inflammatory changes.  No ascites. Musculoskeletal: No significant skeletal lesion. IMPRESSION: No significant abnormality.  Normal appendix. Electronically Signed   By: Ellery Plunkaniel R Mitchell M.D.   On: 08/16/2016 20:56    Procedures Procedures (including critical care time)  Medications Ordered in ED Medications  sodium chloride 0.9 % bolus 1,000 mL (0 mLs Intravenous Stopped 08/16/16 2049)  sodium chloride 0.9 % bolus 1,000 mL (0 mLs Intravenous Stopped 08/16/16 1941)  piperacillin-tazobactam (ZOSYN) IVPB 3.375 g (0 g Intravenous Stopped 08/16/16 2031)    ondansetron (ZOFRAN) injection 4 mg (4 mg Intravenous Given 08/16/16 1912)  iopamidol (ISOVUE-300) 61 % injection 100 mL (100 mLs Intravenous Contrast Given 08/16/16 2014)  sodium chloride 0.9 % bolus 1,000 mL (0 mLs Intravenous Stopped 08/16/16 2311)  ondansetron (ZOFRAN) injection 4 mg (4 mg Intravenous Given 08/16/16 2230)  acetaminophen (TYLENOL) tablet 1,000 mg (1,000 mg Oral Given 08/16/16 2231)  ketorolac (TORADOL) 15 MG/ML injection 30 mg (30 mg Intravenous Given 08/16/16 2335)     Initial Impression / Assessment and Plan / ED Course  I have reviewed the triage vital signs and the nursing notes.  Pertinent labs & imaging results that were available during my care of the patient were reviewed by me and considered in my  medical decision making (see chart for details).     30yo male with history of allergies presents with concern for nausea, vomiting, diarrhea and right sided abdominal pain.  Patient tachycardic to 140s on arrival, and given concern for likely intraabdominal infection ordered blood cx/zosyn empirically as we awaited CT.  CT did not show any signs of appendicitis. Pt without signs of RUQ tenderness or cholecystitis.   Abdominal pain likely secondary to gastroenteritis in setting of negative CT.  Pt with tachycardia improved from 140s down to 100s. EKG shows sinus tachycardia. Troponin negative, no signs of myocarditis. Denies CP, dyspnea, doubt PE. No cough/congestion to suggest pneumonia.  Patient rehydrated with normal saline. He denies drug use, possibility of withdrawal. Have low suspicion for bacteremia without risk factors. Temperature elevated orally, and suspected underlying fever, anxiety may be driving tachycardia.  Gave rx for zofran, bentyl. Recommend continued supportive care. Patient discharged in stable condition with understanding of reasons to return.    Final Clinical Impressions(s) / ED Diagnoses   Final diagnoses:  Gastroenteritis  Right lower quadrant  abdominal pain    New Prescriptions Discharge Medication List as of 08/17/2016 12:42 AM    START taking these medications   Details  dicyclomine (BENTYL) 20 MG tablet Take 1 tablet (20 mg total) by mouth 2 (two) times daily., Starting Mon 08/17/2016, Print    ondansetron (ZOFRAN ODT) 4 MG disintegrating tablet Take 1 tablet (4 mg total) by mouth every 8 (eight) hours as needed for nausea or vomiting., Starting Mon 08/17/2016, Print       I personally performed the services described in this documentation, which was scribed in my presence. The recorded information has been reviewed and is accurate.    Alvira Monday, MD 08/17/16 [phone]

## 2016-08-17 LAB — TROPONIN I: Troponin I: <0.03 ng/mL (ref <0.03)

## 2016-08-17 MED ORDER — ONDANSETRON 4 MG PO TBDP: 4.0000 mg | ORAL_TABLET | Freq: Three times a day (TID) | ORAL | 0 refills | Status: DC | PRN

## 2016-08-17 MED ORDER — DICYCLOMINE HCL 20 MG PO TABS: 20.0000 mg | ORAL_TABLET | Freq: Two times a day (BID) | ORAL | 0 refills | Status: DC

## 2016-08-17 NOTE — ED Notes (Signed)
Pt discharged to home with family 

## 2016-08-18 LAB — URINE CULTURE: Culture: <10000 — AB

## 2016-08-20 ENCOUNTER — Ambulatory Visit (INDEPENDENT_AMBULATORY_CARE_PROVIDER_SITE_OTHER): Payer: 59

## 2016-08-20 DIAGNOSIS — J309 Allergic rhinitis, unspecified: Secondary | ICD-10-CM | POA: Diagnosis not present

## 2016-08-22 LAB — CULTURE, BLOOD (ROUTINE X 2)
Culture: NO GROWTH
Culture: NO GROWTH

## 2016-08-25 ENCOUNTER — Ambulatory Visit (INDEPENDENT_AMBULATORY_CARE_PROVIDER_SITE_OTHER): Payer: 59

## 2016-08-25 DIAGNOSIS — J309 Allergic rhinitis, unspecified: Secondary | ICD-10-CM | POA: Diagnosis not present

## 2016-09-02 ENCOUNTER — Ambulatory Visit (INDEPENDENT_AMBULATORY_CARE_PROVIDER_SITE_OTHER): Payer: 59 | Admitting: *Deleted

## 2016-09-02 DIAGNOSIS — J309 Allergic rhinitis, unspecified: Secondary | ICD-10-CM | POA: Diagnosis not present

## 2016-09-10 ENCOUNTER — Ambulatory Visit: Payer: 59 | Admitting: Allergy and Immunology

## 2016-09-10 ENCOUNTER — Ambulatory Visit (INDEPENDENT_AMBULATORY_CARE_PROVIDER_SITE_OTHER): Payer: 59 | Admitting: *Deleted

## 2016-09-10 DIAGNOSIS — J309 Allergic rhinitis, unspecified: Secondary | ICD-10-CM | POA: Diagnosis not present

## 2016-09-14 ENCOUNTER — Ambulatory Visit (INDEPENDENT_AMBULATORY_CARE_PROVIDER_SITE_OTHER): Payer: 59

## 2016-09-14 DIAGNOSIS — J309 Allergic rhinitis, unspecified: Secondary | ICD-10-CM

## 2016-09-24 ENCOUNTER — Ambulatory Visit (INDEPENDENT_AMBULATORY_CARE_PROVIDER_SITE_OTHER): Payer: 59 | Admitting: Allergy and Immunology

## 2016-09-24 ENCOUNTER — Encounter: Payer: Self-pay | Admitting: Allergy and Immunology

## 2016-09-24 ENCOUNTER — Ambulatory Visit: Payer: Self-pay

## 2016-09-24 VITALS — BP 132/88 | HR 100 | Temp 97.9°F | Resp 20

## 2016-09-24 DIAGNOSIS — T7800XD Anaphylactic reaction due to unspecified food, subsequent encounter: Secondary | ICD-10-CM

## 2016-09-24 DIAGNOSIS — L2089 Other atopic dermatitis: Secondary | ICD-10-CM | POA: Diagnosis not present

## 2016-09-24 DIAGNOSIS — J4530 Mild persistent asthma, uncomplicated: Secondary | ICD-10-CM

## 2016-09-24 DIAGNOSIS — J309 Allergic rhinitis, unspecified: Secondary | ICD-10-CM

## 2016-09-24 DIAGNOSIS — J3089 Other allergic rhinitis: Secondary | ICD-10-CM

## 2016-09-24 MED ORDER — FLUTICASONE PROPIONATE 50 MCG/ACT NA SUSP: 1.0000 | Freq: Two times a day (BID) | NASAL | 5 refills | Status: DC

## 2016-09-24 MED ORDER — AZELASTINE HCL 0.15 % NA SOLN: 1.0000 | Freq: Two times a day (BID) | NASAL | 5 refills | Status: DC

## 2016-09-24 NOTE — Assessment & Plan Note (Signed)
Currently well controlled.  Continue montelukast 10 mg daily at bedtime and albuterol HFA, 1-2 inhalations every 4-6 hours as needed.  Subjective and objective measures of pulmonary function will be followed and the treatment plan will be adjusted accordingly. 

## 2016-09-24 NOTE — Patient Instructions (Signed)
Mild persistent asthma Currently well controlled.  Continue montelukast 10 mg daily at bedtime and albuterol HFA, 1-2 inhalations every 4-6 hours as needed.  Subjective and objective measures of pulmonary function will be followed and the treatment plan will be adjusted accordingly.  Perennial and seasonal allergic rhinitis Stable.  Continue appropriate allergen avoidance measures, aeroallergen immunotherapy as prescribed and as tolerated, montelukast daily, and levocetirizine as needed.  As his insurance would not cover Dymista, prescriptions have been provided for azelastine nasal spray and fluticasone nasal spray.  I have also recommended nasal saline spray (i.e. Simply Saline) as needed prior to medicated nasal sprays.  Medications will be decreased or discontinued as symptom relief from immunotherapy becomes evident.  Atopic dermatitis  Continue appropriate skin care measures and triamcinolone 0.1% ointment sparingly to affected areas as needed.  This medication is not to be used on the face or neck.  Food allergy  Continue careful avoidance of shellfish and have access to epinephrine autoinjector 2 pack in case of accidental ingestion.  Food allergy action plan is in place.   Return in about 6 months (around 03/27/2017), or if symptoms worsen or fail to improve.

## 2016-09-24 NOTE — Assessment & Plan Note (Signed)
Stable.  Continue appropriate allergen avoidance measures, aeroallergen immunotherapy as prescribed and as tolerated, montelukast daily, and levocetirizine as needed.  As his insurance would not cover Dymista, prescriptions have been provided for azelastine nasal spray and fluticasone nasal spray.  I have also recommended nasal saline spray (i.e. Simply Saline) as needed prior to medicated nasal sprays.  Medications will be decreased or discontinued as symptom relief from immunotherapy becomes evident.

## 2016-09-24 NOTE — Progress Notes (Signed)
Follow-up Note  RE: Gavin Green MRN: [id number] DOB: 1986-05-14 Date of Office Visit: 09/24/2016  Primary care provider: Sharlene Dory, DO Referring provider: Sharlene Dory*  History of present illness: Gavin Green is a 31 y.o. male with allergic rhinoconjunctivitis, persistent asthma, food allergy, and atopic dermatitis presenting today for follow up.  He was previously seen in this clinic for his initial evaluation in October 2017.  He reports overall improvement in his encouraged by the fact that he has had no sinus infections in the interval since his previous visit. He is receiving aeroallergen immunotherapy injections without problems or complications.  He reports that his insurance does not cover Dymista. His asthma has been well controlled, he rarely requires albuterol rescue, and denies nocturnal awakenings due to lower respiratory symptoms.  He had nasal vessel cauterization in November and has not had a problem with epistaxis since that time.  He has not eczema related complaints today.  He avoids shellfish and has access to epinephrine autoinjectors.   Assessment and plan: Mild persistent asthma Currently well controlled.  Continue montelukast 10 mg daily at bedtime and albuterol HFA, 1-2 inhalations every 4-6 hours as needed.  Subjective and objective measures of pulmonary function will be followed and the treatment plan will be adjusted accordingly.  Perennial and seasonal allergic rhinitis Stable.  Continue appropriate allergen avoidance measures, aeroallergen immunotherapy as prescribed and as tolerated, montelukast daily, and levocetirizine as needed.  As his insurance would not cover Dymista, prescriptions have been provided for azelastine nasal spray and fluticasone nasal spray.  I have also recommended nasal saline spray (i.e. Simply Saline) as needed prior to medicated nasal sprays.  Medications will be decreased or discontinued as symptom  relief from immunotherapy becomes evident.  Atopic dermatitis  Continue appropriate skin care measures and triamcinolone 0.1% ointment sparingly to affected areas as needed.  This medication is not to be used on the face or neck.  Food allergy  Continue careful avoidance of shellfish and have access to epinephrine autoinjector 2 pack in case of accidental ingestion.  Food allergy action plan is in place.   Meds ordered this encounter  Medications  . Azelastine HCl 0.15 % SOLN    Sig: Place 1 spray into both nostrils 2 (two) times daily.    Dispense:  30 mL    Refill:  5  . fluticasone (FLONASE) 50 MCG/ACT nasal spray    Sig: Place 1 spray into both nostrils 2 (two) times daily.    Dispense:  16 g    Refill:  5    Diagnostics: Spirometry:  Normal with an FEV1 of 102% predicted.  Please see scanned spirometry results for details.    Physical examination: Blood pressure 132/88, pulse 100, temperature 97.9 F (36.6 C), temperature source Oral, resp. rate 20.  General: Alert, interactive, in no acute distress. HEENT: TMs pearly gray, turbinates mildly edematous without discharge, post-pharynx unremarkable. Neck: Supple without lymphadenopathy. Lungs: Clear to auscultation without wheezing, rhonchi or rales. CV: Normal S1, S2 without murmurs. Skin: Warm and dry, without lesions or rashes.  The following portions of the patient's history were reviewed and updated as appropriate: allergies, current medications, past family history, past medical history, past social history, past surgical history and problem list.  Allergies as of 09/24/2016      Reactions   Cephalosporins Rash   Oysters [shellfish Allergy] Other (See Comments)   Allergy      Medication List       Accurate  as of 09/24/16  5:41 PM. Always use your most recent med list.          albuterol (2.5 MG/3ML) 0.083% nebulizer solution Commonly known as:  PROVENTIL Take 2.5 mg by nebulization every 6 (six) hours as  needed for wheezing or shortness of breath.   Albuterol Sulfate 108 (90 Base) MCG/ACT Aepb Commonly known as:  PROAIR RESPICLICK Inhale 2 puffs into the lungs every 4 (four) hours as needed.   Azelastine HCl 0.15 % Soln Place 1 spray into both nostrils 2 (two) times daily.   Azelastine-Fluticasone 137-50 MCG/ACT Susp Commonly known as:  DYMISTA Place 1 spray into both nostrils 2 (two) times daily as needed.   diclofenac 75 MG EC tablet Commonly known as:  VOLTAREN Take 1 tablet (75 mg total) by mouth 2 (two) times daily.   dicyclomine 20 MG tablet Commonly known as:  BENTYL Take 1 tablet (20 mg total) by mouth 2 (two) times daily.   EPINEPHrine 0.3 mg/0.3 mL Soaj injection Commonly known as:  AUVI-Q Use as directed for severe allergic reaction   fexofenadine 180 MG tablet Commonly known as:  ALLEGRA Take 180 mg by mouth daily.   fluticasone 50 MCG/ACT nasal spray Commonly known as:  FLONASE Place 2 sprays into both nostrils daily.   fluticasone 50 MCG/ACT nasal spray Commonly known as:  FLONASE Place 1 spray into both nostrils 2 (two) times daily.   levocetirizine 5 MG tablet Commonly known as:  XYZAL Take 5 mg by mouth every evening.   methylPREDNISolone 4 MG tablet Commonly known as:  MEDROL Medrol dose pack. Take as instructed   montelukast 10 MG tablet Commonly known as:  SINGULAIR Take 1 tablet (10 mg total) by mouth at bedtime.   Olopatadine HCl 0.7 % Soln Commonly known as:  PAZEO Place 1 drop into both eyes daily as needed.   ondansetron 4 MG disintegrating tablet Commonly known as:  ZOFRAN ODT Take 1 tablet (4 mg total) by mouth every 8 (eight) hours as needed for nausea or vomiting.   ranitidine 75 MG tablet Commonly known as:  ZANTAC Take 75 mg by mouth daily as needed for heartburn.   traMADol 50 MG tablet Commonly known as:  ULTRAM 1 tab po q hs for severe pain.   triamcinolone ointment 0.1 % Commonly known as:  KENALOG Apply 1  application topically 2 (two) times daily. Apply to areas below the face sparingly.       Allergies  Allergen Reactions  . Cephalosporins Rash  . Oysters [Shellfish Allergy] Other (See Comments)    Allergy   Review of systems: Review of systems negative except as noted in HPI / PMHx or noted below: Constitutional: Negative.  HENT: Negative.   Eyes: Negative.  Respiratory: Negative.   Cardiovascular: Negative.  Gastrointestinal: Negative.  Genitourinary: Negative.  Musculoskeletal: Negative.  Neurological: Negative.  Endo/Heme/Allergies: Negative.  Cutaneous: Negative.  Past Medical History:  Diagnosis Date  . Allergy   . Asthma   . GERD (gastroesophageal reflux disease)   . Obesity 04/27/2016    Family History  Problem Relation Age of Onset  . Allergic rhinitis Mother   . Hyperlipidemia Father   . Heart disease Father   . Hypertension Father   . Hyperlipidemia Maternal Grandmother   . Hyperlipidemia Maternal Grandfather   . Heart disease Maternal Grandfather   . Diabetes Maternal Grandfather   . Hyperlipidemia Paternal Grandmother   . Stroke Paternal Grandmother   . Diabetes Paternal Grandmother   .  Hyperlipidemia Paternal Grandfather   . Heart disease Paternal Grandfather   . Pulmonary embolism Paternal Grandfather   . Diabetes Paternal Grandfather   . Asthma Paternal Aunt   . Allergic rhinitis Paternal Aunt   . Eczema Neg Hx   . Immunodeficiency Neg Hx   . Urticaria Neg Hx   . Atopy Neg Hx   . Angioedema Neg Hx     Social History   Social History  . Marital status: Married    Spouse name: N/A  . Number of children: N/A  . Years of education: N/A   Occupational History  . Not on file.   Social History Main Topics  . Smoking status: Never Smoker  . Smokeless tobacco: Never Used  . Alcohol use 1.2 oz/week    2 Cans of beer per week  . Drug use: No  . Sexual activity: Not on file   Other Topics Concern  . Not on file   Social History  Narrative  . No narrative on file    I appreciate the opportunity to take part in Geronimo's care. Please do not hesitate to contact me with questions.  Sincerely,   R. Jorene Guestarter Broderick Fonseca, MD

## 2016-09-24 NOTE — Assessment & Plan Note (Signed)
   Continue careful avoidance of shellfish and have access to epinephrine autoinjector 2 pack in case of accidental ingestion.  Food allergy action plan is in place. 

## 2016-09-24 NOTE — Assessment & Plan Note (Signed)
   Continue appropriate skin care measures and triamcinolone 0.1% ointment sparingly to affected areas as needed.  This medication is not to be used on the face or neck.

## 2016-09-30 ENCOUNTER — Ambulatory Visit (INDEPENDENT_AMBULATORY_CARE_PROVIDER_SITE_OTHER): Payer: 59

## 2016-09-30 DIAGNOSIS — J309 Allergic rhinitis, unspecified: Secondary | ICD-10-CM | POA: Diagnosis not present

## 2016-10-06 ENCOUNTER — Ambulatory Visit (INDEPENDENT_AMBULATORY_CARE_PROVIDER_SITE_OTHER): Payer: 59

## 2016-10-06 DIAGNOSIS — J309 Allergic rhinitis, unspecified: Secondary | ICD-10-CM | POA: Diagnosis not present

## 2016-10-08 ENCOUNTER — Ambulatory Visit (INDEPENDENT_AMBULATORY_CARE_PROVIDER_SITE_OTHER): Payer: 59 | Admitting: *Deleted

## 2016-10-08 DIAGNOSIS — J309 Allergic rhinitis, unspecified: Secondary | ICD-10-CM

## 2016-10-21 ENCOUNTER — Ambulatory Visit (INDEPENDENT_AMBULATORY_CARE_PROVIDER_SITE_OTHER): Payer: 59 | Admitting: *Deleted

## 2016-10-21 DIAGNOSIS — J309 Allergic rhinitis, unspecified: Secondary | ICD-10-CM

## 2016-10-26 ENCOUNTER — Ambulatory Visit (INDEPENDENT_AMBULATORY_CARE_PROVIDER_SITE_OTHER): Payer: 59

## 2016-10-26 DIAGNOSIS — J309 Allergic rhinitis, unspecified: Secondary | ICD-10-CM | POA: Diagnosis not present

## 2016-10-29 ENCOUNTER — Ambulatory Visit (INDEPENDENT_AMBULATORY_CARE_PROVIDER_SITE_OTHER): Payer: 59 | Admitting: *Deleted

## 2016-10-29 DIAGNOSIS — J309 Allergic rhinitis, unspecified: Secondary | ICD-10-CM | POA: Diagnosis not present

## 2016-11-09 ENCOUNTER — Ambulatory Visit (INDEPENDENT_AMBULATORY_CARE_PROVIDER_SITE_OTHER): Payer: 59

## 2016-11-09 DIAGNOSIS — J309 Allergic rhinitis, unspecified: Secondary | ICD-10-CM

## 2016-11-18 ENCOUNTER — Ambulatory Visit (INDEPENDENT_AMBULATORY_CARE_PROVIDER_SITE_OTHER): Payer: 59

## 2016-11-18 DIAGNOSIS — J309 Allergic rhinitis, unspecified: Secondary | ICD-10-CM | POA: Diagnosis not present

## 2016-11-24 ENCOUNTER — Ambulatory Visit (INDEPENDENT_AMBULATORY_CARE_PROVIDER_SITE_OTHER): Payer: 59

## 2016-11-24 DIAGNOSIS — J309 Allergic rhinitis, unspecified: Secondary | ICD-10-CM

## 2016-12-01 ENCOUNTER — Ambulatory Visit (INDEPENDENT_AMBULATORY_CARE_PROVIDER_SITE_OTHER): Payer: 59

## 2016-12-01 DIAGNOSIS — J309 Allergic rhinitis, unspecified: Secondary | ICD-10-CM

## 2016-12-08 ENCOUNTER — Ambulatory Visit (INDEPENDENT_AMBULATORY_CARE_PROVIDER_SITE_OTHER): Payer: 59

## 2016-12-08 DIAGNOSIS — J309 Allergic rhinitis, unspecified: Secondary | ICD-10-CM

## 2016-12-15 ENCOUNTER — Ambulatory Visit (INDEPENDENT_AMBULATORY_CARE_PROVIDER_SITE_OTHER): Payer: 59

## 2016-12-15 DIAGNOSIS — J309 Allergic rhinitis, unspecified: Secondary | ICD-10-CM

## 2016-12-22 ENCOUNTER — Ambulatory Visit (INDEPENDENT_AMBULATORY_CARE_PROVIDER_SITE_OTHER): Payer: 59

## 2016-12-22 DIAGNOSIS — J309 Allergic rhinitis, unspecified: Secondary | ICD-10-CM

## 2016-12-29 ENCOUNTER — Ambulatory Visit (INDEPENDENT_AMBULATORY_CARE_PROVIDER_SITE_OTHER): Payer: 59

## 2016-12-29 DIAGNOSIS — J309 Allergic rhinitis, unspecified: Secondary | ICD-10-CM | POA: Diagnosis not present

## 2017-01-01 ENCOUNTER — Other Ambulatory Visit: Payer: Self-pay | Admitting: Allergy and Immunology

## 2017-01-01 DIAGNOSIS — J4530 Mild persistent asthma, uncomplicated: Secondary | ICD-10-CM

## 2017-01-01 DIAGNOSIS — J3089 Other allergic rhinitis: Secondary | ICD-10-CM

## 2017-01-05 ENCOUNTER — Ambulatory Visit (INDEPENDENT_AMBULATORY_CARE_PROVIDER_SITE_OTHER): Payer: 59

## 2017-01-05 DIAGNOSIS — J309 Allergic rhinitis, unspecified: Secondary | ICD-10-CM | POA: Diagnosis not present

## 2017-01-12 ENCOUNTER — Ambulatory Visit (INDEPENDENT_AMBULATORY_CARE_PROVIDER_SITE_OTHER): Payer: 59

## 2017-01-12 DIAGNOSIS — J309 Allergic rhinitis, unspecified: Secondary | ICD-10-CM

## 2017-01-19 ENCOUNTER — Ambulatory Visit (INDEPENDENT_AMBULATORY_CARE_PROVIDER_SITE_OTHER): Payer: 59 | Admitting: *Deleted

## 2017-01-19 DIAGNOSIS — J309 Allergic rhinitis, unspecified: Secondary | ICD-10-CM | POA: Diagnosis not present

## 2017-01-26 ENCOUNTER — Ambulatory Visit (INDEPENDENT_AMBULATORY_CARE_PROVIDER_SITE_OTHER): Payer: 59

## 2017-01-26 DIAGNOSIS — J309 Allergic rhinitis, unspecified: Secondary | ICD-10-CM

## 2017-02-03 ENCOUNTER — Ambulatory Visit (INDEPENDENT_AMBULATORY_CARE_PROVIDER_SITE_OTHER): Payer: 59 | Admitting: *Deleted

## 2017-02-03 DIAGNOSIS — J309 Allergic rhinitis, unspecified: Secondary | ICD-10-CM | POA: Diagnosis not present

## 2017-02-09 ENCOUNTER — Ambulatory Visit (INDEPENDENT_AMBULATORY_CARE_PROVIDER_SITE_OTHER): Payer: 59 | Admitting: *Deleted

## 2017-02-09 DIAGNOSIS — J309 Allergic rhinitis, unspecified: Secondary | ICD-10-CM | POA: Diagnosis not present

## 2017-02-15 ENCOUNTER — Ambulatory Visit (INDEPENDENT_AMBULATORY_CARE_PROVIDER_SITE_OTHER): Payer: 59

## 2017-02-15 DIAGNOSIS — J309 Allergic rhinitis, unspecified: Secondary | ICD-10-CM | POA: Diagnosis not present

## 2017-02-24 ENCOUNTER — Ambulatory Visit (INDEPENDENT_AMBULATORY_CARE_PROVIDER_SITE_OTHER): Payer: 59

## 2017-02-24 DIAGNOSIS — J309 Allergic rhinitis, unspecified: Secondary | ICD-10-CM

## 2017-03-11 ENCOUNTER — Ambulatory Visit (INDEPENDENT_AMBULATORY_CARE_PROVIDER_SITE_OTHER): Payer: 59

## 2017-03-11 DIAGNOSIS — J309 Allergic rhinitis, unspecified: Secondary | ICD-10-CM

## 2017-03-16 ENCOUNTER — Ambulatory Visit (INDEPENDENT_AMBULATORY_CARE_PROVIDER_SITE_OTHER): Payer: 59

## 2017-03-16 DIAGNOSIS — J309 Allergic rhinitis, unspecified: Secondary | ICD-10-CM | POA: Diagnosis not present

## 2017-03-17 DIAGNOSIS — J301 Allergic rhinitis due to pollen: Secondary | ICD-10-CM | POA: Diagnosis not present

## 2017-03-24 ENCOUNTER — Ambulatory Visit (INDEPENDENT_AMBULATORY_CARE_PROVIDER_SITE_OTHER): Payer: 59 | Admitting: *Deleted

## 2017-03-24 DIAGNOSIS — J309 Allergic rhinitis, unspecified: Secondary | ICD-10-CM | POA: Diagnosis not present

## 2017-03-26 HISTORY — PX: REFRACTIVE SURGERY: SHX103

## 2017-03-30 ENCOUNTER — Ambulatory Visit (INDEPENDENT_AMBULATORY_CARE_PROVIDER_SITE_OTHER): Payer: 59

## 2017-03-30 DIAGNOSIS — J309 Allergic rhinitis, unspecified: Secondary | ICD-10-CM | POA: Diagnosis not present

## 2017-04-08 ENCOUNTER — Ambulatory Visit: Payer: Self-pay

## 2017-04-08 ENCOUNTER — Encounter: Payer: Self-pay | Admitting: Allergy and Immunology

## 2017-04-08 ENCOUNTER — Ambulatory Visit (INDEPENDENT_AMBULATORY_CARE_PROVIDER_SITE_OTHER): Payer: 59 | Admitting: Allergy and Immunology

## 2017-04-08 VITALS — BP 126/78 | HR 84 | Temp 98.1°F | Resp 16 | Ht 63.0 in | Wt 182.0 lb

## 2017-04-08 DIAGNOSIS — L2089 Other atopic dermatitis: Secondary | ICD-10-CM

## 2017-04-08 DIAGNOSIS — J309 Allergic rhinitis, unspecified: Secondary | ICD-10-CM

## 2017-04-08 DIAGNOSIS — J4530 Mild persistent asthma, uncomplicated: Secondary | ICD-10-CM | POA: Diagnosis not present

## 2017-04-08 DIAGNOSIS — J3089 Other allergic rhinitis: Secondary | ICD-10-CM | POA: Diagnosis not present

## 2017-04-08 DIAGNOSIS — T7800XD Anaphylactic reaction due to unspecified food, subsequent encounter: Secondary | ICD-10-CM | POA: Diagnosis not present

## 2017-04-08 MED ORDER — MONTELUKAST SODIUM 10 MG PO TABS: 10.0000 mg | ORAL_TABLET | Freq: Every day | ORAL | 3 refills | Status: DC

## 2017-04-08 NOTE — Patient Instructions (Signed)
Mild persistent asthma Currently well controlled.  Continue montelukast 10 mg daily at bedtime and albuterol HFA, 1-2 inhalations every 4-6 hours as needed.  If subjective and objective measures of pulmonary function remain stable, we will consider stepping down therapy on the next visit.  Perennial and seasonal allergic rhinitis Improved and stable  Continue appropriate allergen avoidance measures, aeroallergen immunotherapy as prescribed and as tolerated, montelukast daily, levocetirizine as needed, azelastine nasal spray and fluticasone nasal spray as needed.  Medications will be decreased or discontinued as symptom relief from immunotherapy becomes evident.  Atopic dermatitis  Continue appropriate skin care measures and triamcinolone 0.1% ointment sparingly to affected areas as needed.  This medication is not to be used on the face or neck.  Food allergy  Continue meticulous avoidance of shellfish and have access to epinephrine autoinjector 2 pack in case of accidental ingestion.  Food allergy action plan is in place.   Return in about 6 months (around 10/06/2017), or if symptoms worsen or fail to improve.

## 2017-04-08 NOTE — Progress Notes (Signed)
Follow-up Note  RE: Gavin Green MRN: [id number] DOB: 1986/07/01 Date of Office Visit: 04/08/2017  Primary care provider: Sharlene Dory, DO Referring provider: Sharlene Dory*  History of present illness: Gavin Green is a 31 y.o. male with persistent asthma, allergic rhinitis, food allergy, and atopic dermatitis presenting today for follow up.  He was last seen in this clinic on 09/24/2016.  He reports that in the interval since his previous visit he has not required asthma rescue medication, experienced nocturnal awakenings due to lower respiratory symptoms, nor have activities of daily living been limited.  He is currently receiving aeroallergen immunotherapy injections without problems or complications.  He reports that his nasal symptoms have been well-controlled.  He avoids shellfish and has access to epinephrine autoinjectors.  He has no eczema related complaints today.   Assessment and plan: Mild persistent asthma Currently well controlled.  Continue montelukast 10 mg daily at bedtime and albuterol HFA, 1-2 inhalations every 4-6 hours as needed.  If subjective and objective measures of pulmonary function remain stable, we will consider stepping down therapy on the next visit.  Perennial and seasonal allergic rhinitis Improved and stable  Continue appropriate allergen avoidance measures, aeroallergen immunotherapy as prescribed and as tolerated, montelukast daily, levocetirizine as needed, azelastine nasal spray and fluticasone nasal spray as needed.  Medications will be decreased or discontinued as symptom relief from immunotherapy becomes evident.  Atopic dermatitis  Continue appropriate skin care measures and triamcinolone 0.1% ointment sparingly to affected areas as needed.  This medication is not to be used on the face or neck.  Food allergy  Continue meticulous avoidance of shellfish and have access to epinephrine autoinjector 2 pack in case of  accidental ingestion.  Food allergy action plan is in place.   Meds ordered this encounter  Medications  . montelukast (SINGULAIR) 10 MG tablet    Sig: Take 1 tablet (10 mg total) by mouth at bedtime.    Dispense:  30 tablet    Refill:  3    Diagnostics: Spirometry:  Normal with an FEV1 of 99% predicted.  Please see scanned spirometry results for details.    Physical examination: Blood pressure 126/78, pulse 84, temperature 98.1 F (36.7 C), temperature source Oral, resp. rate 16, height  (1.6 m), weight 182 lb (82.6 kg), SpO2 97 %.  General: Alert, interactive, in no acute distress. HEENT: TMs pearly gray, turbinates minimally edematous without discharge, post-pharynx unremarkable. Neck: Supple without lymphadenopathy. Lungs: Clear to auscultation without wheezing, rhonchi or rales. CV: Normal S1, S2 without murmurs. Skin: Warm and dry, without lesions or rashes.  The following portions of the patient's history were reviewed and updated as appropriate: allergies, current medications, past family history, past medical history, past social history, past surgical history and problem list.  Allergies as of 04/08/2017      Reactions   Cephalosporins Rash   Oysters [shellfish Allergy] Other (See Comments)   Allergy      Medication List       Accurate as of 04/08/17  6:51 PM. Always use your most recent med list.          albuterol (2.5 MG/3ML) 0.083% nebulizer solution Commonly known as:  PROVENTIL Take 2.5 mg by nebulization every 6 (six) hours as needed for wheezing or shortness of breath.   Albuterol Sulfate 108 (90 Base) MCG/ACT Aepb Commonly known as:  PROAIR RESPICLICK Inhale 2 puffs into the lungs every 4 (four) hours as needed.   Azelastine HCl  0.15 % Soln Place 1 spray into both nostrils 2 (two) times daily.   diclofenac 75 MG EC tablet Commonly known as:  VOLTAREN Take 1 tablet (75 mg total) by mouth 2 (two) times daily.   dicyclomine 20 MG  tablet Commonly known as:  BENTYL Take 1 tablet (20 mg total) by mouth 2 (two) times daily.   EPINEPHrine 0.3 mg/0.3 mL Soaj injection Commonly known as:  AUVI-Q Use as directed for severe allergic reaction   fexofenadine 180 MG tablet Commonly known as:  ALLEGRA Take 180 mg by mouth daily.   fluticasone 50 MCG/ACT nasal spray Commonly known as:  FLONASE Place 1 spray into both nostrils 2 (two) times daily.   levocetirizine 5 MG tablet Commonly known as:  XYZAL Take 5 mg by mouth every evening.   montelukast 10 MG tablet Commonly known as:  SINGULAIR Take 1 tablet (10 mg total) by mouth at bedtime.   Olopatadine HCl 0.7 % Soln Commonly known as:  PAZEO Place 1 drop into both eyes daily as needed.   ranitidine 75 MG tablet Commonly known as:  ZANTAC Take 75 mg by mouth daily as needed for heartburn.   traMADol 50 MG tablet Commonly known as:  ULTRAM 1 tab po q hs for severe pain.   triamcinolone ointment 0.1 % Commonly known as:  KENALOG Apply 1 application topically 2 (two) times daily. Apply to areas below the face sparingly.            Discharge Care Instructions        Start     Ordered   04/08/17 0000  Spirometry with Graph    Question:  Where should this test be performed?  Answer:  Other   04/08/17 1733   04/08/17 0000  montelukast (SINGULAIR) 10 MG tablet  Daily at bedtime     04/08/17 1851      Allergies  Allergen Reactions  . Cephalosporins Rash  . Oysters [Shellfish Allergy] Other (See Comments)    Allergy    I appreciate the opportunity to take part in Gavin Green's care. Please do not hesitate to contact me with questions.  Sincerely,   R. Jorene Guest, MD

## 2017-04-08 NOTE — Assessment & Plan Note (Signed)
   Continue meticulous avoidance of shellfish and have access to epinephrine autoinjector 2 pack in case of accidental ingestion.  Food allergy action plan is in place. 

## 2017-04-08 NOTE — Assessment & Plan Note (Signed)
   Continue appropriate skin care measures and triamcinolone 0.1% ointment sparingly to affected areas as needed.  This medication is not to be used on the face or neck.

## 2017-04-08 NOTE — Assessment & Plan Note (Signed)
Currently well controlled.  Continue montelukast 10 mg daily at bedtime and albuterol HFA, 1-2 inhalations every 4-6 hours as needed.  If subjective and objective measures of pulmonary function remain stable, we will consider stepping down therapy on the next visit.

## 2017-04-08 NOTE — Assessment & Plan Note (Signed)
Improved and stable  Continue appropriate allergen avoidance measures, aeroallergen immunotherapy as prescribed and as tolerated, montelukast daily, levocetirizine as needed, azelastine nasal spray and fluticasone nasal spray as needed.  Medications will be decreased or discontinued as symptom relief from immunotherapy becomes evident.

## 2017-04-13 ENCOUNTER — Ambulatory Visit (INDEPENDENT_AMBULATORY_CARE_PROVIDER_SITE_OTHER): Payer: 59

## 2017-04-13 DIAGNOSIS — J309 Allergic rhinitis, unspecified: Secondary | ICD-10-CM

## 2017-04-19 ENCOUNTER — Ambulatory Visit (INDEPENDENT_AMBULATORY_CARE_PROVIDER_SITE_OTHER): Payer: 59

## 2017-04-19 DIAGNOSIS — J309 Allergic rhinitis, unspecified: Secondary | ICD-10-CM

## 2017-04-20 DIAGNOSIS — Z23 Encounter for immunization: Secondary | ICD-10-CM | POA: Diagnosis not present

## 2017-04-28 ENCOUNTER — Ambulatory Visit (INDEPENDENT_AMBULATORY_CARE_PROVIDER_SITE_OTHER): Payer: 59 | Admitting: Family Medicine

## 2017-04-28 ENCOUNTER — Encounter: Payer: Self-pay | Admitting: Family Medicine

## 2017-04-28 VITALS — BP 130/92 | HR 87 | Temp 98.4°F | Ht 63.0 in | Wt 182.2 lb

## 2017-04-28 DIAGNOSIS — Z0000 Encounter for general adult medical examination without abnormal findings: Secondary | ICD-10-CM | POA: Diagnosis not present

## 2017-04-28 DIAGNOSIS — Z114 Encounter for screening for human immunodeficiency virus [HIV]: Secondary | ICD-10-CM

## 2017-04-28 NOTE — Patient Instructions (Signed)
Brush your teeth every day and twice a day.  Aim to do some physical exertion for 150 minutes per week. This is typically divided into 5 days per week, 30 minutes per day. The activity should be enough to get your heart rate up. Anything is better than nothing if you have time constraints.  Give [place] 2-3 business days to get the results of your labs back. If labs are normal, you will likely receive a letter in the mail unless you have MyChart. This can take longer than 2-3 business days.   Healthy Eating Plan Many factors influence your heart health, including eating and exercise habits. Heart (coronary) risk increases with abnormal blood fat (lipid) levels. Heart-healthy meal planning includes limiting unhealthy fats, increasing healthy fats, and making other small dietary changes. This includes maintaining a healthy body weight to help keep lipid levels within a normal range.  WHAT IS MY PLAN?  Your health care provider recommends that you:  Drink a glass of water before meals to help with satiety.  Eat slowly.  An alternative to the water is to add Metamucil. This will help with satiety as well. It does contain calories, unlike water.  WHAT TYPES OF FAT SHOULD I CHOOSE?  Choose healthy fats more often. Choose monounsaturated and polyunsaturated fats, such as olive oil and canola oil, flaxseeds, walnuts, almonds, and seeds.  Eat more omega-3 fats. Good choices include salmon, mackerel, sardines, tuna, flaxseed oil, and ground flaxseeds. Aim to eat fish at least two times each week.  Avoid foods with partially hydrogenated oils in them. These contain trans fats. Examples of foods that contain trans fats are stick margarine, some tub margarines, cookies, crackers, and other baked goods. If you are going to avoid a fat, this is the one to avoid!  WHAT GENERAL GUIDELINES DO I NEED TO FOLLOW?  Check food labels carefully to identify foods with trans fats. Avoid these types of options when  possible.  Fill one half of your plate with vegetables and green salads. Eat 4-5 servings of vegetables per day. A serving of vegetables equals 1 cup of raw leafy vegetables,  cup of raw or cooked cut-up vegetables, or  cup of vegetable juice.  Fill one fourth of your plate with whole grains. Look for the word "whole" as the first word in the ingredient list.  Fill one fourth of your plate with lean protein foods.  Eat 4-5 servings of fruit per day. A serving of fruit equals one medium whole fruit,  cup of dried fruit,  cup of fresh, frozen, or canned fruit. Try to avoid fruits in cups/syrups as the sugar content can be high.  Eat more foods that contain soluble fiber. Examples of foods that contain this type of fiber are apples, broccoli, carrots, beans, peas, and barley. Aim to get 20-30 g of fiber per day.  Eat more home-cooked food and less restaurant, buffet, and fast food.  Limit or avoid alcohol.  Limit foods that are high in starch and sugar.  Avoid fried foods when able.  Cook foods by using methods other than frying. Baking, boiling, grilling, and broiling are all great options. Other fat-reducing suggestions include: ? Removing the skin from poultry. ? Removing all visible fats from meats. ? Skimming the fat off of stews, soups, and gravies before serving them. ? Steaming vegetables in water or broth.  Lose weight if you are overweight. Losing just 5-10% of your initial body weight can help your overall health and prevent  diseases such as diabetes and heart disease.  Increase your consumption of nuts, legumes, and seeds to 4-5 servings per week. One serving of dried beans or legumes equals  cup after being cooked, one serving of nuts equals 1 ounces, and one serving of seeds equals  ounce or 1 tablespoon.  WHAT ARE GOOD FOODS CAN I EAT? Grains Grainy breads (try to find bread that is 3 g of fiber per slice or greater), oatmeal, light popcorn. Whole-grain cereals.  Rice and pasta, including brown rice and those that are made with whole wheat. Edamame pasta is a great alternative to grain pasta. It has a higher protein content. Try to avoid significant consumption of white bread, sugary cereals, or pastries/baked goods.  Vegetables All vegetables. Cooked white potatoes do not count as vegetables.  Fruits All fruits, but limit pineapple and bananas as these fruits have a higher sugar content.  Meats and Other Protein Sources Lean, well-trimmed beef, veal, pork, and lamb. Chicken and [place] without skin. All fish and shellfish. Wild duck, rabbit, pheasant, and venison. Egg whites or low-cholesterol egg substitutes. Dried beans, peas, lentils, and tofu.Seeds and most nuts.  Dairy Low-fat or nonfat cheeses, including ricotta, string, and mozzarella. Skim or 1% milk that is liquid, powdered, or evaporated. Buttermilk that is made with low-fat milk. Nonfat or low-fat yogurt. Soy/Almond milk are good alternatives if you cannot handle dairy.  Beverages Water is the best for you. Sports drinks with less sugar are more desirable unless you are a highly active athlete.  Sweets and Desserts Sherbets and fruit ices. Honey, jam, marmalade, jelly, and syrups. Dark chocolate.  Eat all sweets and desserts in moderation.  Fats and Oils Nonhydrogenated (trans-free) margarines. Vegetable oils, including soybean, sesame, sunflower, olive, peanut, safflower, corn, canola, and cottonseed. Salad dressings or mayonnaise that are made with a vegetable oil. Limit added fats and oils that you use for cooking, baking, salads, and as spreads.  Other Cocoa powder. Coffee and tea. Most condiments.  The items listed above may not be a complete list of recommended foods or beverages. Contact your dietitian for more options.

## 2017-04-28 NOTE — Progress Notes (Signed)
Pre visit review using our clinic review tool, if applicable. No additional management support is needed unless otherwise documented below in the visit note. 

## 2017-04-28 NOTE — Progress Notes (Signed)
Chief Complaint  Patient presents with  . Annual Exam    Well Male Gavin Green is here for a complete physical.   His last physical was >1 year ago.  Current diet: in general, a "unhealthy" diet   Current exercise: nothing; physically active with robotics class Weight trend: stable Does pt snore? Dx'd with OSA Daytime fatigue? No. Seat belt? Yes.    Health maintenance Tetanus- Yes HIV- No  Past Medical History:  Diagnosis Date  . Allergy   . Asthma   . GERD (gastroesophageal reflux disease)   . Obesity 04/27/2016    Past Surgical History:  Procedure Laterality Date  . KNEE ARTHROSCOPY Left 2010  . REFRACTIVE SURGERY  03/26/2017   Medications  Current Outpatient Prescriptions on File Prior to Visit  Medication Sig Dispense Refill  . albuterol (PROVENTIL) (2.5 MG/3ML) 0.083% nebulizer solution Take 2.5 mg by nebulization every 6 (six) hours as needed for wheezing or shortness of breath.    . Albuterol Sulfate (PROAIR RESPICLICK) 108 (90 Base) MCG/ACT AEPB Inhale 2 puffs into the lungs every 4 (four) hours as needed. 1 each 2  . Azelastine HCl 0.15 % SOLN Place 1 spray into both nostrils 2 (two) times daily. 30 mL 5  . EPINEPHrine (AUVI-Q) 0.3 mg/0.3 mL IJ SOAJ injection Use as directed for severe allergic reaction 4 Device 1  . fexofenadine (ALLEGRA) 180 MG tablet Take 180 mg by mouth daily.    . fluticasone (FLONASE) 50 MCG/ACT nasal spray Place 1 spray into both nostrils 2 (two) times daily. 16 g 5  . levocetirizine (XYZAL) 5 MG tablet Take 5 mg by mouth every evening.    . Olopatadine HCl (PAZEO) 0.7 % SOLN Place 1 drop into both eyes daily as needed. 1 Bottle 5  . ranitidine (ZANTAC) 75 MG tablet Take 75 mg by mouth daily as needed for heartburn.     Allergies Allergies  Allergen Reactions  . Cephalosporins Rash  . Oysters [employer] Allergy] Other (See Comments)    Allergy   Family History Family History  Problem Relation Age of Onset  . Allergic rhinitis Mother    . Hyperlipidemia Father   . Heart disease Father   . Hypertension Father   . Diabetes Mellitus II Father   . Hyperlipidemia Maternal Grandmother   . Hyperlipidemia Maternal Grandfather   . Heart disease Maternal Grandfather   . Diabetes Maternal Grandfather   . Hyperlipidemia Paternal Grandmother   . Stroke Paternal Grandmother   . Diabetes Paternal Grandmother   . Hyperlipidemia Paternal Grandfather   . Heart disease Paternal Grandfather   . Pulmonary embolism Paternal Grandfather   . Diabetes Paternal Grandfather   . Asthma Paternal Aunt   . Allergic rhinitis Paternal Aunt   . Eczema Neg Hx   . Immunodeficiency Neg Hx   . Urticaria Neg Hx   . Atopy Neg Hx   . Angioedema Neg Hx     Review of Systems: Constitutional:  no unexpected change in weight, no fevers or chills Eye:  no recent significant change in vision Ear/Nose/Mouth/Throat:  Ears:  no tinnitus or hearing loss Nose/Mouth/Throat:  no complaints of nasal congestion or bleeding, no sore throat and oral sores Cardiovascular:  no chest pain, no palpitations Respiratory:  no cough and no shortness of breath Gastrointestinal:  no abdominal pain, no change in bowel habits, no nausea, vomiting, diarrhea, or constipation and no black or bloody stool GU:  Male: negative for dysuria, frequency, and incontinence and negative for  prostate symptoms Musculoskeletal/Extremities:  no pain, redness, or swelling of the joints Integumentary (Skin/Breast):  no abnormal skin lesions reported Neurologic:  no headaches, no numbness, tingling Endocrine: No weight changes, masses in the neck, heat/cold intolerance, bowel or skin changes, or cardiovascular system symptoms Hematologic/Lymphatic:  no abnormal bleeding, no HIV risk factors, no night sweats, no swollen nodes, no weight loss  Exam BP (!) 130/92 (BP Location: Left Arm, Patient Position: Sitting, Cuff Size: Normal)   Pulse 87   Temp 98.4 F (36.9 C) (Oral)   Ht  (1.6 m)    Wt 182 lb 4 oz (82.7 kg)   SpO2 97%   BMI 32.28 kg/m  General:  well developed, well nourished, in no apparent distress Skin:  no significant moles, warts, or growths Head:  no masses, lesions, or tenderness Eyes:  pupils equal and round, sclera anicteric without injection Ears:  canals without lesions, TMs shiny without retraction, no obvious effusion, no erythema Nose:  nares patent, septum midline, mucosa normal Throat/Pharynx:  lips and gingiva without lesion; tongue and uvula midline; non-inflamed pharynx; no exudates or postnasal drainage Neck: neck supple without adenopathy, thyromegaly, or masses Lungs:  clear to auscultation, breath sounds equal bilaterally, no respiratory distress Cardio:  regular rate and rhythm without murmurs, heart sounds without clicks or rubs Abdomen:  abdomen soft, nontender; bowel sounds normal; no masses or organomegaly Genital (male): Uncircumcised penis, no lesions or discharge; testes present bilaterally without masses or tenderness Rectal: Deferred Musculoskeletal:  symmetrical muscle groups noted without atrophy or deformity Extremities:  no clubbing, cyanosis, or edema, no deformities, no skin discoloration Neuro:  gait normal; deep tendon reflexes normal and symmetric Psych: well oriented with normal range of affect and appropriate judgment/insight  Assessment and Plan  Well adult exam - Plan: CBC, Comprehensive metabolic panel, Lipid panel  Encounter for screening for HIV - Plan: HIV antibody   Well 31 y.o. male. Counseled on diet and exercise. Healthy diet handout given. Encouraged him to brush his teeth everyday twice daily. Other orders as above. Follow up in 1 year pending the above workup. The patient voiced understanding and agreement to the plan.  Jilda Roche Peabody, DO 04/28/17 4:12 PM

## 2017-04-29 ENCOUNTER — Ambulatory Visit (INDEPENDENT_AMBULATORY_CARE_PROVIDER_SITE_OTHER): Payer: 59

## 2017-04-29 ENCOUNTER — Other Ambulatory Visit (INDEPENDENT_AMBULATORY_CARE_PROVIDER_SITE_OTHER): Payer: 59

## 2017-04-29 DIAGNOSIS — Z114 Encounter for screening for human immunodeficiency virus [HIV]: Secondary | ICD-10-CM

## 2017-04-29 DIAGNOSIS — Z0000 Encounter for general adult medical examination without abnormal findings: Secondary | ICD-10-CM | POA: Diagnosis not present

## 2017-04-29 DIAGNOSIS — J309 Allergic rhinitis, unspecified: Secondary | ICD-10-CM | POA: Diagnosis not present

## 2017-04-29 LAB — LIPID PANEL
CHOL/HDL RATIO: 4
Cholesterol: 180 mg/dL (ref 0–200)
HDL: 42.3 mg/dL (ref >39.00)
LDL CALC: 114 mg/dL — AB (ref 0–99)
NONHDL: 137.26
TRIGLYCERIDES: 116 mg/dL (ref 0.0–149.0)
VLDL: 23.2 mg/dL (ref 0.0–40.0)

## 2017-04-29 LAB — CBC
HCT: 42.5 % (ref 39.0–52.0)
HEMOGLOBIN: 14.3 g/dL (ref 13.0–17.0)
MCHC: 33.5 g/dL (ref 30.0–36.0)
MCV: 88 fl (ref 78.0–100.0)
PLATELETS: 334 10*3/uL (ref 150.0–400.0)
RBC: 4.83 Mil/uL (ref 4.22–5.81)
RDW: 13.2 % (ref 11.5–15.5)
WBC: 4.6 10*3/uL (ref 4.0–10.5)

## 2017-04-29 LAB — COMPREHENSIVE METABOLIC PANEL
ALT: 17 U/L (ref 0–53)
AST: 13 U/L (ref 0–37)
Albumin: 4.3 g/dL (ref 3.5–5.2)
Alkaline Phosphatase: 62 U/L (ref 39–117)
BUN: 11 mg/dL (ref 6–23)
CHLORIDE: 104 meq/L (ref 96–112)
CO2: 27 meq/L (ref 19–32)
CREATININE: 0.67 mg/dL (ref 0.40–1.50)
Calcium: 8.2 mg/dL — ABNORMAL LOW (ref 8.4–10.5)
GFR: 148.23 mL/min (ref >60.00)
GLUCOSE: 94 mg/dL (ref 70–99)
Potassium: 3.8 mEq/L (ref 3.5–5.1)
Sodium: 137 mEq/L (ref 135–145)
TOTAL PROTEIN: 7.1 g/dL (ref 6.0–8.3)
Total Bilirubin: 0.5 mg/dL (ref 0.2–1.2)

## 2017-04-30 ENCOUNTER — Other Ambulatory Visit (INDEPENDENT_AMBULATORY_CARE_PROVIDER_SITE_OTHER): Payer: 59

## 2017-04-30 ENCOUNTER — Encounter: Payer: Self-pay | Admitting: Family Medicine

## 2017-04-30 ENCOUNTER — Other Ambulatory Visit: Payer: Self-pay | Admitting: Family Medicine

## 2017-04-30 LAB — COMPREHENSIVE METABOLIC PANEL
ALT: 16 U/L (ref 0–53)
AST: 13 U/L (ref 0–37)
Albumin: 4.2 g/dL (ref 3.5–5.2)
Alkaline Phosphatase: 65 U/L (ref 39–117)
BUN: 11 mg/dL (ref 6–23)
CALCIUM: 8.6 mg/dL (ref 8.4–10.5)
CHLORIDE: 104 meq/L (ref 96–112)
CO2: 30 meq/L (ref 19–32)
CREATININE: 0.63 mg/dL (ref 0.40–1.50)
GFR: 159.14 mL/min (ref >60.00)
Glucose, Bld: 82 mg/dL (ref 70–99)
Potassium: 4.2 mEq/L (ref 3.5–5.1)
SODIUM: 139 meq/L (ref 135–145)
Total Bilirubin: 0.4 mg/dL (ref 0.2–1.2)
Total Protein: 7 g/dL (ref 6.0–8.3)

## 2017-04-30 LAB — HIV ANTIBODY (ROUTINE TESTING W REFLEX): HIV 1&2 Ab, 4th Generation: NONREACTIVE

## 2017-05-04 ENCOUNTER — Ambulatory Visit (INDEPENDENT_AMBULATORY_CARE_PROVIDER_SITE_OTHER): Payer: 59 | Admitting: *Deleted

## 2017-05-04 DIAGNOSIS — J309 Allergic rhinitis, unspecified: Secondary | ICD-10-CM | POA: Diagnosis not present

## 2017-05-11 ENCOUNTER — Ambulatory Visit (INDEPENDENT_AMBULATORY_CARE_PROVIDER_SITE_OTHER): Payer: 59

## 2017-05-11 DIAGNOSIS — J309 Allergic rhinitis, unspecified: Secondary | ICD-10-CM

## 2017-05-18 ENCOUNTER — Ambulatory Visit (INDEPENDENT_AMBULATORY_CARE_PROVIDER_SITE_OTHER): Payer: 59 | Admitting: *Deleted

## 2017-05-18 DIAGNOSIS — J309 Allergic rhinitis, unspecified: Secondary | ICD-10-CM | POA: Diagnosis not present

## 2017-05-28 ENCOUNTER — Ambulatory Visit (INDEPENDENT_AMBULATORY_CARE_PROVIDER_SITE_OTHER): Payer: 59

## 2017-05-28 DIAGNOSIS — J309 Allergic rhinitis, unspecified: Secondary | ICD-10-CM

## 2017-06-01 ENCOUNTER — Ambulatory Visit (INDEPENDENT_AMBULATORY_CARE_PROVIDER_SITE_OTHER): Payer: 59

## 2017-06-01 DIAGNOSIS — J309 Allergic rhinitis, unspecified: Secondary | ICD-10-CM

## 2017-06-04 ENCOUNTER — Other Ambulatory Visit: Payer: Self-pay | Admitting: Allergy and Immunology

## 2017-06-04 DIAGNOSIS — H1013 Acute atopic conjunctivitis, bilateral: Secondary | ICD-10-CM

## 2017-06-08 ENCOUNTER — Ambulatory Visit (INDEPENDENT_AMBULATORY_CARE_PROVIDER_SITE_OTHER): Payer: 59 | Admitting: *Deleted

## 2017-06-08 DIAGNOSIS — J309 Allergic rhinitis, unspecified: Secondary | ICD-10-CM | POA: Diagnosis not present

## 2017-06-17 ENCOUNTER — Ambulatory Visit (INDEPENDENT_AMBULATORY_CARE_PROVIDER_SITE_OTHER): Payer: 59 | Admitting: *Deleted

## 2017-06-17 DIAGNOSIS — J309 Allergic rhinitis, unspecified: Secondary | ICD-10-CM

## 2017-06-22 ENCOUNTER — Ambulatory Visit (INDEPENDENT_AMBULATORY_CARE_PROVIDER_SITE_OTHER): Payer: 59 | Admitting: *Deleted

## 2017-06-22 DIAGNOSIS — J309 Allergic rhinitis, unspecified: Secondary | ICD-10-CM | POA: Diagnosis not present

## 2017-06-30 DIAGNOSIS — Z79899 Other long term (current) drug therapy: Secondary | ICD-10-CM | POA: Diagnosis not present

## 2017-07-05 ENCOUNTER — Ambulatory Visit (INDEPENDENT_AMBULATORY_CARE_PROVIDER_SITE_OTHER): Payer: 59

## 2017-07-05 DIAGNOSIS — J309 Allergic rhinitis, unspecified: Secondary | ICD-10-CM

## 2017-07-15 ENCOUNTER — Other Ambulatory Visit: Payer: Self-pay

## 2017-07-15 DIAGNOSIS — J3089 Other allergic rhinitis: Secondary | ICD-10-CM

## 2017-07-15 MED ORDER — FLUTICASONE PROPIONATE 50 MCG/ACT NA SUSP: 1.0000 | Freq: Two times a day (BID) | NASAL | 5 refills | Status: DC

## 2017-07-15 MED ORDER — AZELASTINE HCL 0.15 % NA SOLN: 1.0000 | Freq: Two times a day (BID) | NASAL | 5 refills | Status: DC

## 2017-07-21 ENCOUNTER — Ambulatory Visit (INDEPENDENT_AMBULATORY_CARE_PROVIDER_SITE_OTHER): Payer: 59 | Admitting: *Deleted

## 2017-07-21 DIAGNOSIS — J309 Allergic rhinitis, unspecified: Secondary | ICD-10-CM

## 2017-07-26 ENCOUNTER — Ambulatory Visit (INDEPENDENT_AMBULATORY_CARE_PROVIDER_SITE_OTHER): Payer: 59

## 2017-07-26 DIAGNOSIS — J309 Allergic rhinitis, unspecified: Secondary | ICD-10-CM

## 2017-07-28 DIAGNOSIS — Z79899 Other long term (current) drug therapy: Secondary | ICD-10-CM | POA: Diagnosis not present

## 2017-08-02 ENCOUNTER — Ambulatory Visit (INDEPENDENT_AMBULATORY_CARE_PROVIDER_SITE_OTHER): Payer: 59 | Admitting: *Deleted

## 2017-08-02 DIAGNOSIS — J309 Allergic rhinitis, unspecified: Secondary | ICD-10-CM | POA: Diagnosis not present

## 2017-08-03 NOTE — Progress Notes (Signed)
VIALS EXP 08-03-18 

## 2017-08-04 DIAGNOSIS — J3089 Other allergic rhinitis: Secondary | ICD-10-CM | POA: Diagnosis not present

## 2017-08-16 ENCOUNTER — Ambulatory Visit (INDEPENDENT_AMBULATORY_CARE_PROVIDER_SITE_OTHER): Payer: 59

## 2017-08-16 DIAGNOSIS — J309 Allergic rhinitis, unspecified: Secondary | ICD-10-CM | POA: Diagnosis not present

## 2017-08-23 ENCOUNTER — Ambulatory Visit (INDEPENDENT_AMBULATORY_CARE_PROVIDER_SITE_OTHER): Payer: 59

## 2017-08-23 DIAGNOSIS — J309 Allergic rhinitis, unspecified: Secondary | ICD-10-CM | POA: Diagnosis not present

## 2017-09-01 ENCOUNTER — Ambulatory Visit (INDEPENDENT_AMBULATORY_CARE_PROVIDER_SITE_OTHER): Payer: 59 | Admitting: *Deleted

## 2017-09-01 DIAGNOSIS — J309 Allergic rhinitis, unspecified: Secondary | ICD-10-CM

## 2017-09-08 ENCOUNTER — Encounter: Payer: Self-pay | Admitting: Family Medicine

## 2017-09-09 ENCOUNTER — Ambulatory Visit (INDEPENDENT_AMBULATORY_CARE_PROVIDER_SITE_OTHER): Payer: 59 | Admitting: Orthopaedic Surgery

## 2017-09-09 ENCOUNTER — Encounter (INDEPENDENT_AMBULATORY_CARE_PROVIDER_SITE_OTHER): Payer: Self-pay | Admitting: Orthopaedic Surgery

## 2017-09-09 DIAGNOSIS — M79641 Pain in right hand: Secondary | ICD-10-CM | POA: Diagnosis not present

## 2017-09-09 DIAGNOSIS — M79642 Pain in left hand: Secondary | ICD-10-CM | POA: Diagnosis not present

## 2017-09-09 MED ORDER — NABUMETONE 500 MG PO TABS: 500.0000 mg | ORAL_TABLET | Freq: Two times a day (BID) | ORAL | 1 refills | Status: DC | PRN
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
## 2017-09-10 ENCOUNTER — Other Ambulatory Visit (INDEPENDENT_AMBULATORY_CARE_PROVIDER_SITE_OTHER): Payer: Self-pay

## 2017-09-10 ENCOUNTER — Telehealth (INDEPENDENT_AMBULATORY_CARE_PROVIDER_SITE_OTHER): Payer: Self-pay | Admitting: *Deleted

## 2017-09-10 DIAGNOSIS — M79642 Pain in left hand: Principal | ICD-10-CM

## 2017-09-10 DIAGNOSIS — M79641 Pain in right hand: Secondary | ICD-10-CM

## 2017-09-13 ENCOUNTER — Ambulatory Visit (INDEPENDENT_AMBULATORY_CARE_PROVIDER_SITE_OTHER): Payer: 59

## 2017-09-13 DIAGNOSIS — J309 Allergic rhinitis, unspecified: Secondary | ICD-10-CM | POA: Diagnosis not present

## 2017-09-13 NOTE — Telephone Encounter (Signed)
There is no damage that can be done for a delay in studies.  Carpal tunnel syndrome is a long-term process.  I'm fine with trying to get studies elsewhere, but I'm also not concerned about waiting until Gavin Green is available.  Give him that re-assurance.

## 2017-09-13 NOTE — Telephone Encounter (Signed)
I called and left a message with patient letting him know no damage will be done waiting and that he will just need to move his appt with Dr. Magnus Green out a couple days after the nerve study, if you don't mind calling him back and giving him an appt. Thank you

## 2017-09-13 NOTE — Telephone Encounter (Signed)
Want me to try to send him somewhere else?

## 2017-09-14 ENCOUNTER — Other Ambulatory Visit (INDEPENDENT_AMBULATORY_CARE_PROVIDER_SITE_OTHER): Payer: Self-pay

## 2017-09-14 ENCOUNTER — Telehealth (INDEPENDENT_AMBULATORY_CARE_PROVIDER_SITE_OTHER): Payer: Self-pay | Admitting: Orthopaedic Surgery

## 2017-09-14 MED ORDER — TRAMADOL HCL 50 MG PO TABS: 50.0000 mg | ORAL_TABLET | Freq: Three times a day (TID) | ORAL | 0 refills | Status: DC | PRN

## 2017-09-14 NOTE — Telephone Encounter (Signed)
Send in some tramadol 50 mg, 1-2 every 8 hours as needed and relafen 500 mg, 1 every 12 hours as needed

## 2017-09-14 NOTE — Telephone Encounter (Signed)
See below

## 2017-09-14 NOTE — Telephone Encounter (Signed)
Patient called stating that at his appointment last week with Dr. Magnus IvanBlackman he suggested a few pain medications, and the patient would like to take him up on the offer and be prescribed some pain medication. CB # [phone]

## 2017-09-15 ENCOUNTER — Other Ambulatory Visit (INDEPENDENT_AMBULATORY_CARE_PROVIDER_SITE_OTHER): Payer: Self-pay

## 2017-09-15 MED ORDER — GABAPENTIN 100 MG PO CAPS: 100.0000 mg | ORAL_CAPSULE | Freq: Three times a day (TID) | ORAL | 1 refills | Status: DC

## 2017-09-15 NOTE — Telephone Encounter (Signed)
neurontin 100 mg up to 3 times daily

## 2017-09-15 NOTE — Telephone Encounter (Signed)
Patient states he's not interested in the tramadol, he said the pain isn't that bad. But he states he was interested in the medication you suggested for "nerve pain"

## 2017-09-15 NOTE — Telephone Encounter (Signed)
Sent in to his pharmacy

## 2017-09-27 ENCOUNTER — Ambulatory Visit (INDEPENDENT_AMBULATORY_CARE_PROVIDER_SITE_OTHER): Payer: 59

## 2017-09-27 DIAGNOSIS — J309 Allergic rhinitis, unspecified: Secondary | ICD-10-CM | POA: Diagnosis not present

## 2017-09-29 ENCOUNTER — Ambulatory Visit (INDEPENDENT_AMBULATORY_CARE_PROVIDER_SITE_OTHER): Payer: 59 | Admitting: Orthopaedic Surgery

## 2017-10-01 ENCOUNTER — Ambulatory Visit (INDEPENDENT_AMBULATORY_CARE_PROVIDER_SITE_OTHER): Payer: 59 | Admitting: Physical Medicine and Rehabilitation

## 2017-10-01 ENCOUNTER — Encounter (INDEPENDENT_AMBULATORY_CARE_PROVIDER_SITE_OTHER): Payer: Self-pay | Admitting: Physical Medicine and Rehabilitation

## 2017-10-01 DIAGNOSIS — R202 Paresthesia of skin: Secondary | ICD-10-CM

## 2017-10-01 NOTE — Progress Notes (Signed)
 .  Numeric Pain Rating Scale and Functional Assessment Average Pain 3   In the last MONTH (on 0-10 scale) has pain interfered with the following?  1. General activity like being  able to carry out your everyday physical activities such as walking, climbing stairs, carrying groceries, or moving a chair?  Rating(1)   +Driver, -BT, -Dye Allergies.  

## 2017-10-05 ENCOUNTER — Encounter: Payer: Self-pay | Admitting: Physician Assistant

## 2017-10-05 ENCOUNTER — Ambulatory Visit (INDEPENDENT_AMBULATORY_CARE_PROVIDER_SITE_OTHER): Payer: 59 | Admitting: Physician Assistant

## 2017-10-05 VITALS — BP 126/72 | HR 98 | Temp 98.0°F | Ht 63.0 in | Wt 178.8 lb

## 2017-10-05 DIAGNOSIS — J029 Acute pharyngitis, unspecified: Secondary | ICD-10-CM | POA: Diagnosis not present

## 2017-10-05 LAB — POCT RAPID STREP A (OFFICE): RAPID STREP A SCREEN: NEGATIVE

## 2017-10-05 NOTE — Patient Instructions (Addendum)
It was great to see you!  You have a viral upper respiratory infection. Antibiotics are not needed for this.  Viral infections usually take 7-10 days to resolve.  The cough can last a few weeks to go away.   Push fluids and get plenty of rest. Please return if you are not improving as expected, or if you have high fevers (>101.5) or difficulty swallowing or worsening productive cough.  Call clinic with questions.  I hope you start feeling better soon!  

## 2017-10-05 NOTE — Progress Notes (Signed)
Gavin Green is a 32 y.o. male here for a new problem.  I acted as a Neurosurgeon for [employer], PA-C Gavin Mull, LPN  History of Present Illness:   Chief Complaint  Patient presents with  . Sore Throat    3 days, hard to swallow  . Cough    productive with greenish mucus    HPI Patient reports sore throat. This has been going on for 2-3 days. Has been around others with similar symptoms -- went to a robotics competition with several high school students, none to his knowledge with confirmed flu or strep. No fever. No diarrhea, nausea. No change in appetite. Wants to make sure he doesn't have strep.   Past Medical History:  Diagnosis Date  . Allergy   . Asthma   . GERD (gastroesophageal reflux disease)   . Obesity 04/27/2016     Social History   Socioeconomic History  . Marital status: Married    Spouse name: Not on file  . Number of children: Not on file  . Years of education: Not on file  . Highest education level: Not on file  Social Needs  . Financial resource strain: Not on file  . Food insecurity - worry: Not on file  . Food insecurity - inability: Not on file  . Transportation needs - medical: Not on file  . Transportation needs - non-medical: Not on file  Occupational History  . Not on file  Tobacco Use  . Smoking status: Never Smoker  . Smokeless tobacco: Never Used  Substance and Sexual Activity  . Alcohol use: Yes    Alcohol/week: 1.2 oz    Types: 2 Cans of beer per week  . Drug use: No  . Sexual activity: Not on file  Other Topics Concern  . Not on file  Social History Narrative  . Not on file    Past Surgical History:  Procedure Laterality Date  . KNEE ARTHROSCOPY Left 2010  . REFRACTIVE SURGERY  03/26/2017    Family History  Problem Relation Age of Onset  . Allergic rhinitis Mother   . Hyperlipidemia Father   . Heart disease Father   . Hypertension Father   . Diabetes Mellitus II Father   . Hyperlipidemia Maternal Grandmother   .  Hyperlipidemia Maternal Grandfather   . Heart disease Maternal Grandfather   . Diabetes Maternal Grandfather   . Hyperlipidemia Paternal Grandmother   . Stroke Paternal Grandmother   . Diabetes Paternal Grandmother   . Hyperlipidemia Paternal Grandfather   . Heart disease Paternal Grandfather   . Pulmonary embolism Paternal Grandfather   . Diabetes Paternal Grandfather   . Asthma Paternal Aunt   . Allergic rhinitis Paternal Aunt   . Eczema Neg Hx   . Immunodeficiency Neg Hx   . Urticaria Neg Hx   . Atopy Neg Hx   . Angioedema Neg Hx     Allergies  Allergen Reactions  . Cephalosporins Rash  . Oysters [Shellfish Allergy] Other (See Comments)    Allergy    Current Medications:   Current Outpatient Medications:  .  albuterol (PROVENTIL) (2.5 MG/3ML) 0.083% nebulizer solution, Take 2.5 mg by nebulization every 6 (six) hours as needed for wheezing or shortness of breath., Disp: , Rfl:  .  Albuterol Sulfate (PROAIR RESPICLICK) 108 (90 Base) MCG/ACT AEPB, Inhale 2 puffs into the lungs every 4 (four) hours as needed., Disp: 1 each, Rfl: 2 .  Azelastine HCl 0.15 % SOLN, Place 1 spray into both nostrils  2 (two) times daily., Disp: 30 mL, Rfl: 5 .  EPINEPHrine (AUVI-Q) 0.3 mg/0.3 mL IJ SOAJ injection, Use as directed for severe allergic reaction, Disp: 4 Device, Rfl: 1 .  fluticasone (FLONASE) 50 MCG/ACT nasal spray, Place 1 spray into both nostrils 2 (two) times daily., Disp: 16 g, Rfl: 5 .  levocetirizine (XYZAL) 5 MG tablet, Take 5 mg by mouth every evening., Disp: , Rfl:  .  nabumetone (RELAFEN) 500 MG tablet, Take 1 tablet (500 mg total) by mouth 2 (two) times daily as needed., Disp: 60 tablet, Rfl: 1 .  PAZEO 0.7 % SOLN, INSTILL 1 DROP INTO BOTH EYES DAILY AS NEEDED, Disp: 2.5 mL, Rfl: 3 .  ranitidine (ZANTAC) 75 MG tablet, Take 75 mg by mouth daily as needed for heartburn., Disp: , Rfl:    Review of Systems:   ROS  Negative unless otherwise specified per HPI.   Vitals:    Vitals:   10/05/17 1443  BP: 126/72  Pulse: 98  Temp: 98 F (36.7 C)  TempSrc: Oral  SpO2: 98%  Weight: 178 lb 12.8 oz (81.1 kg)  Height: 5\' 3"  (1.6 m)     Body mass index is 31.67 kg/m.  Physical Exam:   Physical Exam  Constitutional: He appears well-developed. He is cooperative.  Non-toxic appearance. He does not have a sickly appearance. He does not appear ill. No distress.  HENT:  Head: Normocephalic and atraumatic.  Right Ear: Tympanic membrane, external ear and ear canal normal. Tympanic membrane is not erythematous, not retracted and not bulging.  Left Ear: Tympanic membrane, external ear and ear canal normal. Tympanic membrane is not erythematous, not retracted and not bulging.  Nose: Nose normal. Right sinus exhibits no maxillary sinus tenderness and no frontal sinus tenderness. Left sinus exhibits no maxillary sinus tenderness and no frontal sinus tenderness.  Mouth/Throat: Uvula is midline and mucous membranes are normal. Posterior oropharyngeal erythema present. No posterior oropharyngeal edema. Tonsils are 1+ on the right. Tonsils are 1+ on the left. No tonsillar exudate.  Eyes: Conjunctivae and lids are normal.  Neck: Trachea normal.  Cardiovascular: Normal rate, regular rhythm, S1 normal, S2 normal and normal heart sounds.  Pulmonary/Chest: Effort normal and breath sounds normal. He has no decreased breath sounds. He has no wheezes. He has no rhonchi. He has no rales.  Lymphadenopathy:    He has no cervical adenopathy.  Neurological: He is alert.  Skin: Skin is warm, dry and intact.  Psychiatric: He has a normal mood and affect. His speech is normal and behavior is normal.  Nursing note and vitals reviewed.  Results for orders placed or performed in visit on 10/05/17  POCT rapid strep A  Result Value Ref Range   Rapid Strep A Screen Negative Negative    Assessment and Plan:    Gavin Green was seen today for sore throat and cough.  Diagnoses and all orders for  this visit:  Pharyngitis, unspecified etiology -     POCT rapid strep A   No red flags on exam. Strep negative, suspect viral etiology. Discussed supportive care measures. Reviewed return precautions including worsening fever, SOB, worsening cough or other concerns. Push fluids and rest. I recommend that patient follow-up if symptoms worsen or persist despite treatment x 7-10 days, sooner if needed.  . Reviewed expectations re: course of current medical issues. . Discussed self-management of symptoms. . Outlined signs and symptoms indicating need for more acute intervention. . Patient verbalized understanding and all questions were answered. .Marland Kitchen  See orders for this visit as documented in the electronic medical record. . Patient received an After-Visit Summary.  CMA or LPN served as scribe during this visit. History, Physical, and Plan performed by medical provider. Documentation and orders reviewed and attested to.  Inda Coke, PA-C

## 2017-10-07 ENCOUNTER — Encounter: Payer: Self-pay | Admitting: Allergy and Immunology

## 2017-10-07 ENCOUNTER — Ambulatory Visit: Payer: Self-pay

## 2017-10-07 ENCOUNTER — Ambulatory Visit (INDEPENDENT_AMBULATORY_CARE_PROVIDER_SITE_OTHER): Payer: 59 | Admitting: Allergy and Immunology

## 2017-10-07 VITALS — BP 126/84 | HR 86 | Temp 98.3°F | Resp 16

## 2017-10-07 DIAGNOSIS — L2089 Other atopic dermatitis: Secondary | ICD-10-CM | POA: Diagnosis not present

## 2017-10-07 DIAGNOSIS — J309 Allergic rhinitis, unspecified: Secondary | ICD-10-CM

## 2017-10-07 DIAGNOSIS — J4530 Mild persistent asthma, uncomplicated: Secondary | ICD-10-CM

## 2017-10-07 DIAGNOSIS — T7800XD Anaphylactic reaction due to unspecified food, subsequent encounter: Secondary | ICD-10-CM | POA: Diagnosis not present

## 2017-10-07 DIAGNOSIS — J3089 Other allergic rhinitis: Secondary | ICD-10-CM | POA: Diagnosis not present

## 2017-10-07 NOTE — Assessment & Plan Note (Signed)
   Continue careful avoidance of shellfish and have access to epinephrine autoinjector 2 pack in case of accidental ingestion.  Food allergy action plan is in place. 

## 2017-10-07 NOTE — Patient Instructions (Signed)
Mild persistent asthma Well-controlled.  Continue albuterol every 6 hours if needed and 15 minutes prior to vigorous exercise.  During upper respiratory tract infections and asthma flares, montelukast 10 mg daily may be added until symptoms have returned to baseline.  Subjective and objective measures of pulmonary function will be followed and the treatment plan will be adjusted accordingly.  Perennial and seasonal allergic rhinitis Improved and stable.  Continue appropriate allergen avoidance measures and aeroallergen immunotherapy injections as prescribed and as tolerated.  I have recommended trying using azelastine and/or fluticasone nasal spray on an as needed rather than scheduled basis.  Atopic dermatitis  Continue appropriate skin care measures and triamcinolone 0.1% ointment sparingly to affected areas as needed.  This medication is not to be used on the face or neck.  Food allergy  Continue careful avoidance of shellfish and have access to epinephrine autoinjector 2 pack in case of accidental ingestion.  Food allergy action plan is in place.   Return in about 5 months (around 03/09/2018), or if symptoms worsen or fail to improve.

## 2017-10-07 NOTE — Progress Notes (Signed)
Follow-up Note  RE: Gavin HarmanJuan Sorg MRN: [id number] DOB: 09-19-1985 Date of Office Visit: 10/07/2017  Primary care provider: Sharlene DoryWendling, Nicholas Paul, DO Referring provider: Sharlene DoryWendling, Nicholas Paul*  History of present illness: Gavin Green is a 32 y.o. male with persistent asthma, allergic rhinitis, food allergy, and atopic dermatitis presenting today for follow up.  He was last seen in this clinic in September 2018.  He reports that aeroallergen gin immunotherapy injections have "absolutely been helping" to reduce his allergy symptoms.  He notes improvement in his upper and lower respiratory symptoms.  He reports that he discontinued montelukast a few months ago.  Over the past few months he has not required asthma rescue medication, experienced nocturnal awakenings due to lower respiratory symptoms, nor have activities of daily living been limited.  He currently uses azelastine nasal spray and fluticasone nasal spray on a daily basis.  He avoids shellfish and has access to epinephrine autoinjectors.  Assessment and plan: Mild persistent asthma Well-controlled.  Continue albuterol every 6 hours if needed and 15 minutes prior to vigorous exercise.  During upper respiratory tract infections and asthma flares, montelukast 10 mg daily may be added until symptoms have returned to baseline.  Subjective and objective measures of pulmonary function will be followed and the treatment plan will be adjusted accordingly.  Perennial and seasonal allergic rhinitis Improved and stable.  Continue appropriate allergen avoidance measures and aeroallergen immunotherapy injections as prescribed and as tolerated.  I have recommended trying using azelastine and/or fluticasone nasal spray on an as needed rather than scheduled basis.  Atopic dermatitis  Continue appropriate skin care measures and triamcinolone 0.1% ointment sparingly to affected areas as needed.  This medication is not to be used on the face or  neck.  Food allergy  Continue careful avoidance of shellfish and have access to epinephrine autoinjector 2 pack in case of accidental ingestion.  Food allergy action plan is in place.   Diagnostics: Spirometry:  Normal with an FEV1 of 107% predicted.  Please see scanned spirometry results for details.    Physical examination: Blood pressure 126/84, pulse 86, temperature 98.3 F (36.8 C), temperature source Oral, resp. rate 16, SpO2 97 %.  General: Alert, interactive, in no acute distress. HEENT: TMs pearly gray, turbinates mildly edematous with clear discharge, post-pharynx mildly erythematous. Neck: Supple without lymphadenopathy. Lungs: Clear to auscultation without wheezing, rhonchi or rales. CV: Normal S1, S2 without murmurs. Skin: Warm and dry, without lesions or rashes.  The following portions of the patient's history were reviewed and updated as appropriate: allergies, current medications, past family history, past medical history, past social history, past surgical history and problem list.  Allergies as of 10/07/2017      Reactions   Cephalosporins Rash   Oysters [shellfish Allergy] Other (See Comments)   Allergy      Medication List        Accurate as of 10/07/17  5:48 PM. Always use your most recent med list.          albuterol (2.5 MG/3ML) 0.083% nebulizer solution Commonly known as:  PROVENTIL Take 2.5 mg by nebulization every 6 (six) hours as needed for wheezing or shortness of breath.   Albuterol Sulfate 108 (90 Base) MCG/ACT Aepb Commonly known as:  PROAIR RESPICLICK Inhale 2 puffs into the lungs every 4 (four) hours as needed.   Azelastine HCl 0.15 % Soln Place 1 spray into both nostrils 2 (two) times daily.   EPINEPHrine 0.3 mg/0.3 mL Soaj injection Commonly known  as:  AUVI-Q Use as directed for severe allergic reaction   fluticasone 50 MCG/ACT nasal spray Commonly known as:  FLONASE Place 1 spray into both nostrils 2 (two) times daily.     levocetirizine 5 MG tablet Commonly known as:  XYZAL Take 5 mg by mouth every evening.   nabumetone 500 MG tablet Commonly known as:  RELAFEN Take 1 tablet (500 mg total) by mouth 2 (two) times daily as needed.   PAZEO 0.7 % Soln Generic drug:  Olopatadine HCl INSTILL 1 DROP INTO BOTH EYES DAILY AS NEEDED   ranitidine 75 MG tablet Commonly known as:  ZANTAC Take 75 mg by mouth daily as needed for heartburn.       Allergies  Allergen Reactions  . Cephalosporins Rash  . Oysters [Shellfish Allergy] Other (See Comments)    Allergy    I appreciate the opportunity to take part in Autumn's care. Please do not hesitate to contact me with questions.  Sincerely,   R. Jorene Guest, MD

## 2017-10-07 NOTE — Assessment & Plan Note (Signed)
   Continue appropriate skin care measures and triamcinolone 0.1% ointment sparingly to affected areas as needed.  This medication is not to be used on the face or neck. 

## 2017-10-07 NOTE — Assessment & Plan Note (Signed)
Well-controlled.  Continue albuterol every 6 hours if needed and 15 minutes prior to vigorous exercise.  During upper respiratory tract infections and asthma flares, montelukast 10 mg daily may be added until symptoms have returned to baseline.  Subjective and objective measures of pulmonary function will be followed and the treatment plan will be adjusted accordingly.

## 2017-10-07 NOTE — Assessment & Plan Note (Signed)
Improved and stable.  Continue appropriate allergen avoidance measures and aeroallergen immunotherapy injections as prescribed and as tolerated.  I have recommended trying using azelastine and/or fluticasone nasal spray on an as needed rather than scheduled basis.

## 2017-10-11 ENCOUNTER — Ambulatory Visit (INDEPENDENT_AMBULATORY_CARE_PROVIDER_SITE_OTHER): Payer: 59 | Admitting: Orthopaedic Surgery

## 2017-10-11 ENCOUNTER — Encounter (INDEPENDENT_AMBULATORY_CARE_PROVIDER_SITE_OTHER): Payer: Self-pay | Admitting: Orthopaedic Surgery

## 2017-10-11 DIAGNOSIS — G5603 Carpal tunnel syndrome, bilateral upper limbs: Secondary | ICD-10-CM | POA: Diagnosis not present

## 2017-10-11 NOTE — Procedures (Signed)
EMG & NCV Findings: Evaluation of the left median motor and the right median motor nerves showed reduced amplitude (L4.4, R3.1 mV).  The left median (across palm) sensory and the right median (across palm) sensory nerves showed prolonged distal peak latency (Wrist, L3.7, R3.8 ms).  All remaining nerves (as indicated in the following tables) were within normal limits.  Left vs. Right side comparison data for the ulnar motor nerve indicates abnormal L-R velocity difference (A Elbow-B Elbow, 32 m/s).  All remaining left vs. right side differences were within normal limits.    All examined muscles (as indicated in the following table) showed no evidence of electrical instability.    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  Careful clinical correlation is paramount as this does not explain all of his symptoms.  There is no significant electrodiagnostic evidence of any other focal nerve entrapment, brachial plexopathy, cervical radiculopathy or generalized peripheral neuropathy.   Recommendations: 1.  Follow-up with referring physician. 2.  Continue current management of symptoms.   Nerve Conduction Studies Anti Sensory Summary Table   Stim Site NR Peak (ms) Norm Peak (ms) P-T Amp (V) Norm P-T Amp Site1 Site2 Delta-P (ms) Dist (cm) Vel (m/s) Norm Vel (m/s)  Left Median Acr Palm Anti Sensory (2nd Digit)  32.8C  Wrist    *3.7 <3.6 27.9 >10 Wrist Palm 2.1 0.0    Palm    1.6 <2.0 58.0         Right Median Acr Palm Anti Sensory (2nd Digit)  31.9C  Wrist    *3.8 <3.6 22.5 >10 Wrist Palm 2.0 0.0    Palm    1.8 <2.0 30.9         Left Radial Anti Sensory (Base 1st Digit)  32.7C  Wrist    1.8 <3.1 26.2  Wrist Base 1st Digit 1.8 0.0    Right Radial Anti Sensory (Base 1st Digit)  32C  Wrist    2.0 <3.1 32.5  Wrist Base 1st Digit 2.0 0.0    Left  Ulnar Anti Sensory (5th Digit)  33.2C  Wrist    2.8 <3.7 42.3 >15.0 Wrist 5th Digit 2.8 14.0 50 >38  Right Ulnar Anti Sensory (5th Digit)  32.2C  Wrist    2.8 <3.7 41.3 >15.0 Wrist 5th Digit 2.8 14.0 50 >38   Motor Summary Table   Stim Site NR Onset (ms) Norm Onset (ms) O-P Amp (mV) Norm O-P Amp Site1 Site2 Delta-0 (ms) Dist (cm) Vel (m/s) Norm Vel (m/s)  Left Median Motor (Abd Poll Brev)  33.1C  Wrist    4.2 <4.2 *4.4 >5 Elbow Wrist 3.8 21.5 57 >50  Elbow    8.0  5.9         Right Median Motor (Abd Poll Brev)  31.9C  Wrist    4.1 <4.2 *3.1 >5 Elbow Wrist 3.6 20.5 57 >50  Elbow    7.7  4.8         Left Ulnar Motor (Abd Dig Min)  33.2C  Wrist    2.7 <4.2 8.7 >3 B Elbow Wrist 3.2 20.0 63 >53  B Elbow    5.9  7.9  A Elbow B Elbow 1.1 6.5 59 >53  A Elbow    7.0  7.9         Right Ulnar Motor (Abd Dig Min)  32.1C  Wrist  2.7 <4.2 10.1 >3 B Elbow Wrist 3.2 20.0 63 >53  B Elbow    5.9  10.1  A Elbow B Elbow 1.1 10.0 91 >53  A Elbow    7.0  10.3          EMG   Side Muscle Nerve Root Ins Act Fibs Psw Amp Dur Poly Recrt Int Dennie Bible Comment  Right Abd Poll Brev Median C8-T1 Nml Nml Nml Nml Nml 0 Nml Nml   Right 1stDorInt Ulnar C8-T1 Nml Nml Nml Nml Nml 0 Nml Nml   Right PronatorTeres Median C6-7 Nml Nml Nml Nml Nml 0 Nml Nml   Right Biceps Musculocut C5-6 Nml Nml Nml Nml Nml 0 Nml Nml   Right Deltoid Axillary C5-6 Nml Nml Nml Nml Nml 0 Nml Nml     Nerve Conduction Studies Anti Sensory Left/Right Comparison   Stim Site L Lat (ms) R Lat (ms) L-R Lat (ms) L Amp (V) R Amp (V) L-R Amp (%) Site1 Site2 L Vel (m/s) R Vel (m/s) L-R Vel (m/s)  Median Acr Palm Anti Sensory (2nd Digit)  32.8C  Wrist *3.7 *3.8 0.1 27.9 22.5 19.4 Wrist Palm     Palm 1.6 1.8 0.2 58.0 30.9 46.7       Radial Anti Sensory (Base 1st Digit)  32.7C  Wrist 1.8 2.0 0.2 26.2 32.5 19.4 Wrist Base 1st Digit     Ulnar Anti Sensory (5th Digit)  33.2C  Wrist 2.8 2.8 0.0 42.3 41.3 2.4 Wrist 5th Digit 50 50 0   Motor  Left/Right Comparison   Stim Site L Lat (ms) R Lat (ms) L-R Lat (ms) L Amp (mV) R Amp (mV) L-R Amp (%) Site1 Site2 L Vel (m/s) R Vel (m/s) L-R Vel (m/s)  Median Motor (Abd Poll Brev)  33.1C  Wrist 4.2 4.1 0.1 *4.4 *3.1 29.5 Elbow Wrist 57 57 0  Elbow 8.0 7.7 0.3 5.9 4.8 18.6       Ulnar Motor (Abd Dig Min)  33.2C  Wrist 2.7 2.7 0.0 8.7 10.1 13.9 B Elbow Wrist 63 63 0  B Elbow 5.9 5.9 0.0 7.9 10.1 21.8 A Elbow B Elbow 59 91 *32  A Elbow 7.0 7.0 0.0 7.9 10.3 23.3          Waveforms:

## 2017-10-11 NOTE — Progress Notes (Signed)
Gavin Green - 32 y.o. male MRN [id number]  Date of birth: April 07, 1986  Office Visit Note: Visit Date: 10/01/2017 PCP: Sharlene Dory, DO Referred by: Sharlene Dory*  Subjective: Chief Complaint  Patient presents with  . Right Hand - Numbness, Tingling  . Left Hand - Numbness, Tingling  . Right Arm - Numbness, Tingling  . Left Arm - Numbness, Tingling   HPI: Gavin Green Gave is a pleasant 32 year old right-hand-dominant gentleman who comes in today at the request of Dr. Magnus Ivan for electrodiagnostic studies of both upper limbs.  He reports numbness and tingling in both the right and left arms in particular the right hand is worse at the middle and ring finger.  He reports the symptoms started in October 2017 without any specific injury.  He states that certain positions make his pain worse.  If he bends over and droops down and relaxes his arms the pain is better.  He has not had prior electrodiagnostic studies.  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  He denies frank radicular pain.  He has not had a cervical MRI or cervical surgery.   ROS Otherwise per HPI.  Assessment & Plan: Visit Diagnoses:  1. Paresthesia of skin     Plan: No additional findings.  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  Careful clinical correlation is paramount as this does not explain all of his symptoms.  There is no significant electrodiagnostic evidence of any other focal nerve entrapment, brachial plexopathy, cervical radiculopathy or generalized peripheral neuropathy.   Recommendations: 1.  Follow-up with referring physician. 2.  Continue current management of symptoms.   Meds & Orders: No orders of the defined types were placed in this encounter.   Orders Placed This Encounter  Procedures  . NCV with EMG  (electromyography)    Follow-up: Return for Dr. Magnus Ivan as scheduled.   Procedures: No procedures performed  EMG & NCV Findings: Evaluation of the left median motor and the right median motor nerves showed reduced amplitude (L4.4, R3.1 mV).  The left median (across palm) sensory and the right median (across palm) sensory nerves showed prolonged distal peak latency (Wrist, L3.7, R3.8 ms).  All remaining nerves (as indicated in the following tables) were within normal limits.  Left vs. Right side comparison data for the ulnar motor nerve indicates abnormal L-R velocity difference (A Elbow-B Elbow, 32 m/s).  All remaining left vs. right side differences were within normal limits.    All examined muscles (as indicated in the following table) showed no evidence of electrical instability.    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  Careful clinical correlation is paramount as this does not explain all of his symptoms.  There is no significant electrodiagnostic evidence of any other focal nerve entrapment, brachial plexopathy, cervical radiculopathy or generalized peripheral neuropathy.   Recommendations: 1.  Follow-up with referring physician. 2.  Continue current management of symptoms.   Nerve Conduction Studies Anti Sensory Summary Table   Stim Site NR Peak (ms) Norm Peak (ms) P-T Amp (V) Norm P-T Amp Site1 Site2 Delta-P (ms) Dist (cm) Vel (m/s) Norm Vel (m/s)  Left Median Acr Palm Anti Sensory (2nd Digit)  32.8C  Wrist    *3.7 <3.6 27.9 >  10 Wrist Palm 2.1 0.0    Palm    1.6 <2.0 58.0         Right Median Acr Palm Anti Sensory (2nd Digit)  31.9C  Wrist    *3.8 <3.6 22.5 >10 Wrist Palm 2.0 0.0    Palm    1.8 <2.0 30.9         Left Radial Anti Sensory (Base 1st Digit)  32.7C  Wrist    1.8 <3.1 26.2  Wrist Base 1st Digit 1.8 0.0      Right Radial Anti Sensory (Base 1st Digit)  32C  Wrist    2.0 <3.1 32.5  Wrist Base 1st Digit 2.0 0.0    Left Ulnar Anti Sensory (5th Digit)  33.2C  Wrist    2.8 <3.7 42.3 >15.0 Wrist 5th Digit 2.8 14.0 50 >38  Right Ulnar Anti Sensory (5th Digit)  32.2C  Wrist    2.8 <3.7 41.3 >15.0 Wrist 5th Digit 2.8 14.0 50 >38   Motor Summary Table   Stim Site NR Onset (ms) Norm Onset (ms) O-P Amp (mV) Norm O-P Amp Site1 Site2 Delta-0 (ms) Dist (cm) Vel (m/s) Norm Vel (m/s)  Left Median Motor (Abd Poll Brev)  33.1C  Wrist    4.2 <4.2 *4.4 >5 Elbow Wrist 3.8 21.5 57 >50  Elbow    8.0  5.9         Right Median Motor (Abd Poll Brev)  31.9C  Wrist    4.1 <4.2 *3.1 >5 Elbow Wrist 3.6 20.5 57 >50  Elbow    7.7  4.8         Left Ulnar Motor (Abd Dig Min)  33.2C  Wrist    2.7 <4.2 8.7 >3 B Elbow Wrist 3.2 20.0 63 >53  B Elbow    5.9  7.9  A Elbow B Elbow 1.1 6.5 59 >53  A Elbow    7.0  7.9         Right Ulnar Motor (Abd Dig Min)  32.1C  Wrist    2.7 <4.2 10.1 >3 B Elbow Wrist 3.2 20.0 63 >53  B Elbow    5.9  10.1  A Elbow B Elbow 1.1 10.0 91 >53  A Elbow    7.0  10.3          EMG   Side Muscle Nerve Root Ins Act Fibs Psw Amp Dur Poly Recrt Int Dennie Bible Comment  Right Abd Poll Brev Median C8-T1 Nml Nml Nml Nml Nml 0 Nml Nml   Right 1stDorInt Ulnar C8-T1 Nml Nml Nml Nml Nml 0 Nml Nml   Right PronatorTeres Median C6-7 Nml Nml Nml Nml Nml 0 Nml Nml   Right Biceps Musculocut C5-6 Nml Nml Nml Nml Nml 0 Nml Nml   Right Deltoid Axillary C5-6 Nml Nml Nml Nml Nml 0 Nml Nml     Nerve Conduction Studies Anti Sensory Left/Right Comparison   Stim Site L Lat (ms) R Lat (ms) L-R Lat (ms) L Amp (V) R Amp (V) L-R Amp (%) Site1 Site2 L Vel (m/s) R Vel (m/s) L-R Vel (m/s)  Median Acr Palm Anti Sensory (2nd Digit)  32.8C  Wrist *3.7 *3.8 0.1 27.9 22.5 19.4 Wrist Palm     Palm 1.6 1.8 0.2 58.0 30.9 46.7       Radial Anti Sensory (Base 1st Digit)  32.7C  Wrist 1.8 2.0 0.2 26.2 32.5 19.4 Wrist Base 1st Digit      Ulnar Anti Sensory (5th Digit)  33.2C  Wrist 2.8 2.8 0.0 42.3 41.3 2.4 Wrist 5th Digit 50 50 0   Motor Left/Right Comparison   Stim Site L Lat (ms) R Lat (ms) L-R Lat (ms) L Amp (mV) R Amp (mV) L-R Amp (%) Site1 Site2 L Vel (m/s) R Vel (m/s) L-R Vel (m/s)  Median Motor (Abd Poll Brev)  33.1C  Wrist 4.2 4.1 0.1 *4.4 *3.1 29.5 Elbow Wrist 57 57 0  Elbow 8.0 7.7 0.3 5.9 4.8 18.6       Ulnar Motor (Abd Dig Min)  33.2C  Wrist 2.7 2.7 0.0 8.7 10.1 13.9 B Elbow Wrist 63 63 0  B Elbow 5.9 5.9 0.0 7.9 10.1 21.8 A Elbow B Elbow 59 91 *32  A Elbow 7.0 7.0 0.0 7.9 10.3 23.3          Waveforms:                     Clinical History: No specialty comments available.   He reports that he has never smoked. He has never used smokeless tobacco. No results for input(s): HGBA1C, LABURIC in the last 8760 hours.  Objective:  VS:  HT:    WT:   BMI:     BP:   HR: bpm  TEMP: ( )  RESP:  Physical Exam  Musculoskeletal:  Inspection reveals no atrophy of the bilateral APB or FDI or hand intrinsics. There is no swelling, color changes, allodynia or dystrophic changes. There is 5 out of 5 strength in the bilateral wrist extension, finger abduction and long finger flexion. There is intact sensation to light touch in all dermatomal and peripheral nerve distributions. There is a negative Phalen's test bilaterally. There is a negative Hoffmann's test bilaterally.    Ortho Exam Imaging: No results found.  Past Medical/Family/Surgical/Social History: Medications & Allergies reviewed per EMR, new medications updated. Patient Active Problem List   Diagnosis Date Noted  . Bilateral hand pain 09/09/2017  . Perennial and seasonal allergic rhinitis 05/07/2016  . Allergic conjunctivitis 05/07/2016  . Mild persistent asthma 05/07/2016  . Food allergy 05/07/2016  . Atopic dermatitis 05/07/2016  . Obesity 04/27/2016   Past Medical History:  Diagnosis Date  . Allergy   . Asthma   . GERD  (gastroesophageal reflux disease)   . Obesity 04/27/2016   Family History  Problem Relation Age of Onset  . Allergic rhinitis Mother   . Hyperlipidemia Father   . Heart disease Father   . Hypertension Father   . Diabetes Mellitus II Father   . Hyperlipidemia Maternal Grandmother   . Hyperlipidemia Maternal Grandfather   . Heart disease Maternal Grandfather   . Diabetes Maternal Grandfather   . Hyperlipidemia Paternal Grandmother   . Stroke Paternal Grandmother   . Diabetes Paternal Grandmother   . Hyperlipidemia Paternal Grandfather   . Heart disease Paternal Grandfather   . Pulmonary embolism Paternal Grandfather   . Diabetes Paternal Grandfather   . Asthma Paternal Aunt   . Allergic rhinitis Paternal Aunt   . Eczema Neg Hx   . Immunodeficiency Neg Hx   . Urticaria Neg Hx   . Atopy Neg Hx   . Angioedema Neg Hx    Past Surgical History:  Procedure Laterality Date  . KNEE ARTHROSCOPY Left 2010  . REFRACTIVE SURGERY  03/26/2017   Social History   Occupational History  . Not on file  Tobacco Use  . Smoking status: Never Smoker  . Smokeless tobacco: Never Used  Substance and  Sexual Activity  . Alcohol use: Yes    Alcohol/week: 1.2 oz    Types: 2 Cans of beer per week  . Drug use: No  . Sexual activity: Not on file

## 2017-10-11 NOTE — Progress Notes (Signed)
The patient is returning for follow-up today after having bilateral upper extremity nerve conduction studies.  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  He is tried night splints and with the failure of conservative treatment we wanted to at least get an idea of the extent of his carpal tunnel syndrome.  He still has bilateral ulnar-sided wrist pain that hurts with extremes activities with extremes of rotation of his wrist.  He has tried night splints.  On exam he still shows no signs of muscle atrophy and only slight numbness in his hands.  More of his pain is in his forearm and wrist today.  The nerve conduction studies are shared with him and does show mild carpal tunnel syndrome bilaterally.  At this point we will just keep conservative with activity modification night splints as well as anti-inflammatories.  I talked him about topical anti-inflammatories well.  All questions concerns were answered and addressed.  He will follow-up as needed.

## 2017-10-18 ENCOUNTER — Ambulatory Visit (INDEPENDENT_AMBULATORY_CARE_PROVIDER_SITE_OTHER): Payer: 59 | Admitting: *Deleted

## 2017-10-18 DIAGNOSIS — J309 Allergic rhinitis, unspecified: Secondary | ICD-10-CM | POA: Diagnosis not present

## 2017-10-27 DIAGNOSIS — Z79899 Other long term (current) drug therapy: Secondary | ICD-10-CM | POA: Diagnosis not present

## 2017-10-29 ENCOUNTER — Ambulatory Visit (INDEPENDENT_AMBULATORY_CARE_PROVIDER_SITE_OTHER): Payer: 59 | Admitting: *Deleted

## 2017-10-29 DIAGNOSIS — J309 Allergic rhinitis, unspecified: Secondary | ICD-10-CM | POA: Diagnosis not present

## 2017-11-01 DIAGNOSIS — J301 Allergic rhinitis due to pollen: Secondary | ICD-10-CM | POA: Diagnosis not present

## 2017-11-01 NOTE — Progress Notes (Signed)
VIALS EXP 11-02-18 

## 2017-11-16 ENCOUNTER — Ambulatory Visit (INDEPENDENT_AMBULATORY_CARE_PROVIDER_SITE_OTHER): Payer: 59

## 2017-11-16 DIAGNOSIS — J309 Allergic rhinitis, unspecified: Secondary | ICD-10-CM

## 2017-11-30 ENCOUNTER — Telehealth: Payer: Self-pay

## 2017-11-30 ENCOUNTER — Ambulatory Visit (INDEPENDENT_AMBULATORY_CARE_PROVIDER_SITE_OTHER): Payer: 59

## 2017-11-30 DIAGNOSIS — J309 Allergic rhinitis, unspecified: Secondary | ICD-10-CM | POA: Diagnosis not present

## 2017-11-30 MED ORDER — LEVOCETIRIZINE DIHYDROCHLORIDE 5 MG PO TABS: 5.0000 mg | ORAL_TABLET | Freq: Every evening | ORAL | 5 refills | Status: DC

## 2017-11-30 NOTE — Telephone Encounter (Signed)
Please discard last note.  Xyzal was in med list.  Sent in Rx.  Patient notified.  Thank you.

## 2017-11-30 NOTE — Telephone Encounter (Signed)
Patient would like Rx for Xyzal 5 mg called in. Please advise.  Patient currently taking azelastine nasal spray and fluticasone nasal spray.  Has been buying the Xyzal OTC.

## 2017-12-14 ENCOUNTER — Ambulatory Visit (INDEPENDENT_AMBULATORY_CARE_PROVIDER_SITE_OTHER): Payer: 59

## 2017-12-14 DIAGNOSIS — J309 Allergic rhinitis, unspecified: Secondary | ICD-10-CM

## 2017-12-21 ENCOUNTER — Ambulatory Visit (INDEPENDENT_AMBULATORY_CARE_PROVIDER_SITE_OTHER): Payer: 59

## 2017-12-21 DIAGNOSIS — J309 Allergic rhinitis, unspecified: Secondary | ICD-10-CM

## 2017-12-27 ENCOUNTER — Ambulatory Visit (INDEPENDENT_AMBULATORY_CARE_PROVIDER_SITE_OTHER): Payer: 59

## 2017-12-27 DIAGNOSIS — J309 Allergic rhinitis, unspecified: Secondary | ICD-10-CM

## 2018-01-03 ENCOUNTER — Ambulatory Visit (INDEPENDENT_AMBULATORY_CARE_PROVIDER_SITE_OTHER): Payer: 59 | Admitting: *Deleted

## 2018-01-03 DIAGNOSIS — J309 Allergic rhinitis, unspecified: Secondary | ICD-10-CM | POA: Diagnosis not present

## 2018-01-10 ENCOUNTER — Ambulatory Visit (INDEPENDENT_AMBULATORY_CARE_PROVIDER_SITE_OTHER): Payer: 59

## 2018-01-10 DIAGNOSIS — J309 Allergic rhinitis, unspecified: Secondary | ICD-10-CM | POA: Diagnosis not present

## 2018-01-25 ENCOUNTER — Ambulatory Visit (INDEPENDENT_AMBULATORY_CARE_PROVIDER_SITE_OTHER): Payer: 59

## 2018-01-25 DIAGNOSIS — J309 Allergic rhinitis, unspecified: Secondary | ICD-10-CM

## 2018-01-26 DIAGNOSIS — Z79899 Other long term (current) drug therapy: Secondary | ICD-10-CM | POA: Diagnosis not present

## 2018-02-07 ENCOUNTER — Ambulatory Visit (INDEPENDENT_AMBULATORY_CARE_PROVIDER_SITE_OTHER): Payer: 59

## 2018-02-07 DIAGNOSIS — J309 Allergic rhinitis, unspecified: Secondary | ICD-10-CM | POA: Diagnosis not present

## 2018-02-16 DIAGNOSIS — J3089 Other allergic rhinitis: Secondary | ICD-10-CM | POA: Diagnosis not present

## 2018-02-23 ENCOUNTER — Ambulatory Visit (INDEPENDENT_AMBULATORY_CARE_PROVIDER_SITE_OTHER): Payer: 59

## 2018-02-23 DIAGNOSIS — J309 Allergic rhinitis, unspecified: Secondary | ICD-10-CM

## 2018-03-07 ENCOUNTER — Ambulatory Visit (INDEPENDENT_AMBULATORY_CARE_PROVIDER_SITE_OTHER): Payer: 59 | Admitting: *Deleted

## 2018-03-07 DIAGNOSIS — J309 Allergic rhinitis, unspecified: Secondary | ICD-10-CM | POA: Diagnosis not present

## 2018-03-22 ENCOUNTER — Ambulatory Visit (INDEPENDENT_AMBULATORY_CARE_PROVIDER_SITE_OTHER): Payer: 59 | Admitting: *Deleted

## 2018-03-22 DIAGNOSIS — J309 Allergic rhinitis, unspecified: Secondary | ICD-10-CM | POA: Diagnosis not present

## 2018-04-06 ENCOUNTER — Ambulatory Visit (INDEPENDENT_AMBULATORY_CARE_PROVIDER_SITE_OTHER): Payer: 59

## 2018-04-06 DIAGNOSIS — J309 Allergic rhinitis, unspecified: Secondary | ICD-10-CM | POA: Diagnosis not present

## 2018-04-12 ENCOUNTER — Ambulatory Visit (INDEPENDENT_AMBULATORY_CARE_PROVIDER_SITE_OTHER): Payer: 59

## 2018-04-12 DIAGNOSIS — J309 Allergic rhinitis, unspecified: Secondary | ICD-10-CM

## 2018-04-18 ENCOUNTER — Ambulatory Visit (INDEPENDENT_AMBULATORY_CARE_PROVIDER_SITE_OTHER): Payer: 59

## 2018-04-18 DIAGNOSIS — J309 Allergic rhinitis, unspecified: Secondary | ICD-10-CM

## 2018-04-25 ENCOUNTER — Ambulatory Visit (INDEPENDENT_AMBULATORY_CARE_PROVIDER_SITE_OTHER): Payer: 59

## 2018-04-25 DIAGNOSIS — J309 Allergic rhinitis, unspecified: Secondary | ICD-10-CM | POA: Diagnosis not present

## 2018-04-27 DIAGNOSIS — Z79899 Other long term (current) drug therapy: Secondary | ICD-10-CM | POA: Diagnosis not present

## 2018-05-02 ENCOUNTER — Ambulatory Visit (INDEPENDENT_AMBULATORY_CARE_PROVIDER_SITE_OTHER): Payer: 59

## 2018-05-02 DIAGNOSIS — J309 Allergic rhinitis, unspecified: Secondary | ICD-10-CM

## 2018-05-04 ENCOUNTER — Encounter: Payer: Self-pay | Admitting: Family Medicine

## 2018-05-04 ENCOUNTER — Ambulatory Visit (INDEPENDENT_AMBULATORY_CARE_PROVIDER_SITE_OTHER): Payer: 59 | Admitting: Family Medicine

## 2018-05-04 VITALS — BP 122/84 | HR 107 | Temp 99.3°F | Ht 63.0 in | Wt 174.5 lb

## 2018-05-04 DIAGNOSIS — Z0000 Encounter for general adult medical examination without abnormal findings: Secondary | ICD-10-CM | POA: Diagnosis not present

## 2018-05-04 NOTE — Progress Notes (Signed)
Chief Complaint  Patient presents with  . Annual Exam    Well Male Gavin Green is here for a complete physical.   His last physical was >1 year ago.  Current diet: in general, a "healthy" diet.   Current exercise: walking, active at work  Weight trend: losing Daytime fatigue? No. Seat belt? Yes.    Health maintenance Tetanus- Yes HIV- Yes  Past Medical History:  Diagnosis Date  . Allergy   . Asthma   . GERD (gastroesophageal reflux disease)   . Obesity 04/27/2016     Past Surgical History:  Procedure Laterality Date  . KNEE ARTHROSCOPY Left 2010  . REFRACTIVE SURGERY  03/26/2017    Medications  Current Outpatient Medications on File Prior to Visit  Medication Sig Dispense Refill  . albuterol (PROVENTIL) (2.5 MG/3ML) 0.083% nebulizer solution Take 2.5 mg by nebulization every 6 (six) hours as needed for wheezing or shortness of breath.    . Albuterol Sulfate (PROAIR RESPICLICK) 108 (90 Base) MCG/ACT AEPB Inhale 2 puffs into the lungs every 4 (four) hours as needed. 1 each 2  . Azelastine HCl 0.15 % SOLN Place 1 spray into both nostrils 2 (two) times daily. 30 mL 5  . EPINEPHrine (AUVI-Q) 0.3 mg/0.3 mL IJ SOAJ injection Use as directed for severe allergic reaction 4 Device 1  . fluticasone (FLONASE) 50 MCG/ACT nasal spray Place 1 spray into both nostrils 2 (two) times daily. 16 g 5  . levocetirizine (XYZAL) 5 MG tablet Take 1 tablet (5 mg total) by mouth every evening. 30 tablet 5  . nabumetone (RELAFEN) 500 MG tablet Take 1 tablet (500 mg total) by mouth 2 (two) times daily as needed. 60 tablet 1  . PAZEO 0.7 % SOLN INSTILL 1 DROP INTO BOTH EYES DAILY AS NEEDED 2.5 mL 3  . ranitidine (ZANTAC) 75 MG tablet Take 75 mg by mouth daily as needed for heartburn.     Allergies Allergies  Allergen Reactions  . Cephalosporins Rash  . Oysters [employer] Allergy] Other (See Comments)    Allergy    Family History Family History  Problem Relation Age of Onset  . Allergic  rhinitis Mother   . Hyperlipidemia Father   . Heart disease Father   . Hypertension Father   . Diabetes Mellitus II Father   . Hyperlipidemia Maternal Grandmother   . Hyperlipidemia Maternal Grandfather   . Heart disease Maternal Grandfather   . Diabetes Maternal Grandfather   . Hyperlipidemia Paternal Grandmother   . Stroke Paternal Grandmother   . Diabetes Paternal Grandmother   . Hyperlipidemia Paternal Grandfather   . Heart disease Paternal Grandfather   . Pulmonary embolism Paternal Grandfather   . Diabetes Paternal Grandfather   . Asthma Paternal Aunt   . Allergic rhinitis Paternal Aunt   . Eczema Neg Hx   . Immunodeficiency Neg Hx   . Urticaria Neg Hx   . Atopy Neg Hx   . Angioedema Neg Hx     Review of Systems: Constitutional: no fevers or chills Eye:  no recent significant change in vision Ear/Nose/Mouth/Throat:  Ears:  no tinnitus or hearing loss Nose/Mouth/Throat:  no complaints of nasal congestion, no sore throat Cardiovascular:  no chest pain, no palpitations Respiratory:  no cough and no shortness of breath Gastrointestinal:  no abdominal pain, no change in bowel habits GU:  Male: negative for dysuria, frequency, and incontinence and negative for prostate symptoms Musculoskeletal/Extremities:  no pain, redness, or swelling of the joints Integumentary (Skin/Breast):  no  abnormal skin lesions reported Neurologic:  no headaches, no numbness, tingling Endocrine: No unexpected weight changes Hematologic/Lymphatic:  no night sweats  Exam BP 122/84 (BP Location: Left Arm, Patient Position: Sitting, Cuff Size: Normal)   Pulse (!) 107   Temp 99.3 F (37.4 C) (Oral)   Ht 5\' 3"  (1.6 m)   Wt 174 lb 8 oz (79.2 kg)   SpO2 96%   BMI 30.91 kg/m  General:  well developed, well nourished, in no apparent distress Skin:  no significant moles, warts, or growths Head:  no masses, lesions, or tenderness Eyes:  pupils equal and round, sclera anicteric without  injection Ears:  canals without lesions, TMs shiny without retraction, no obvious effusion, no erythema Nose:  nares patent, septum midline, mucosa normal Throat/Pharynx:  lips and gingiva without lesion; tongue and uvula midline; non-inflamed pharynx; no exudates or postnasal drainage Neck: neck supple without adenopathy, thyromegaly, or masses Lungs:  clear to auscultation, breath sounds equal bilaterally, no respiratory distress Cardio:  regular rate and rhythm, no bruits, no LE edema Abdomen:  abdomen soft, nontender; bowel sounds normal; no masses or organomegaly Genital (male): Uncircumcised penis, no lesions or discharge; testes present bilaterally without masses or tenderness Rectal: Deferred Musculoskeletal:  symmetrical muscle groups noted without atrophy or deformity Extremities:  no clubbing, cyanosis, or edema, no deformities, no skin discoloration Neuro:  gait normal; deep tendon reflexes normal and symmetric Psych: well oriented with normal range of affect and appropriate judgment/insight  Assessment and Plan  Well adult exam - Plan: Lipid panel, Comprehensive metabolic panel   Well 32 y.o. male. Counseled on diet and exercise. Other orders as above. Follow up in 1 year pending the above workup. The patient voiced understanding and agreement to the plan.  Jilda Roche [place], DO 05/04/18 3:55 PM

## 2018-05-04 NOTE — Patient Instructions (Addendum)
Give us 2-3 business days to get the results of your labs back.   Keep the diet clean and stay active.  Aim to do some physical exertion for 150 minutes per week. This is typically divided into 5 days per week, 30 minutes per day. The activity should be enough to get your heart rate up. Anything is better than nothing if you have time constraints.  Do monthly self testicular checks in the shower. You are feeling for lumps/bumps that don't belong. If you feel anything like this, let me know!  Let us know if you need anything. 

## 2018-05-04 NOTE — Progress Notes (Signed)
Pre visit review using our clinic review tool, if applicable. No additional management support is needed unless otherwise documented below in the visit note. 

## 2018-05-05 ENCOUNTER — Other Ambulatory Visit (INDEPENDENT_AMBULATORY_CARE_PROVIDER_SITE_OTHER): Payer: 59

## 2018-05-05 DIAGNOSIS — Z0000 Encounter for general adult medical examination without abnormal findings: Secondary | ICD-10-CM

## 2018-05-05 LAB — COMPREHENSIVE METABOLIC PANEL
ALBUMIN: 4.4 g/dL (ref 3.5–5.2)
ALK PHOS: 72 U/L (ref 39–117)
ALT: 15 U/L (ref 0–53)
AST: 11 U/L (ref 0–37)
BILIRUBIN TOTAL: 0.5 mg/dL (ref 0.2–1.2)
BUN: 15 mg/dL (ref 6–23)
CO2: 26 mEq/L (ref 19–32)
CREATININE: 0.71 mg/dL (ref 0.40–1.50)
Calcium: 8.9 mg/dL (ref 8.4–10.5)
Chloride: 104 mEq/L (ref 96–112)
GFR: 137.7 mL/min (ref >60.00)
GLUCOSE: 97 mg/dL (ref 70–99)
Potassium: 3.9 mEq/L (ref 3.5–5.1)
SODIUM: 137 meq/L (ref 135–145)
TOTAL PROTEIN: 7 g/dL (ref 6.0–8.3)

## 2018-05-05 LAB — LIPID PANEL
CHOLESTEROL: 163 mg/dL (ref 0–200)
HDL: 46 mg/dL (ref >39.00)
LDL Cholesterol: 101 mg/dL — ABNORMAL HIGH (ref 0–99)
NONHDL: 117.13
Total CHOL/HDL Ratio: 4
Triglycerides: 80 mg/dL (ref 0.0–149.0)
VLDL: 16 mg/dL (ref 0.0–40.0)

## 2018-05-16 ENCOUNTER — Ambulatory Visit (INDEPENDENT_AMBULATORY_CARE_PROVIDER_SITE_OTHER): Payer: 59

## 2018-05-16 DIAGNOSIS — J309 Allergic rhinitis, unspecified: Secondary | ICD-10-CM | POA: Diagnosis not present

## 2018-05-30 ENCOUNTER — Ambulatory Visit (INDEPENDENT_AMBULATORY_CARE_PROVIDER_SITE_OTHER): Payer: 59

## 2018-05-30 DIAGNOSIS — J309 Allergic rhinitis, unspecified: Secondary | ICD-10-CM | POA: Diagnosis not present

## 2018-06-01 DIAGNOSIS — J301 Allergic rhinitis due to pollen: Secondary | ICD-10-CM

## 2018-06-01 NOTE — Progress Notes (Signed)
Vials exp 06-02-19 

## 2018-06-13 ENCOUNTER — Ambulatory Visit (INDEPENDENT_AMBULATORY_CARE_PROVIDER_SITE_OTHER): Payer: 59 | Admitting: *Deleted

## 2018-06-13 DIAGNOSIS — J309 Allergic rhinitis, unspecified: Secondary | ICD-10-CM | POA: Diagnosis not present

## 2018-06-14 IMAGING — CT CT ABD-PELV W/ CM
2 of 4 series · 16 of 46 positions shown, 18 images · IV contrast (APPLIED)
Comparison: None.

CLINICAL DATA: Right-sided abdominal pain and fever. Onset this
morning.

EXAM:
CT ABDOMEN AND PELVIS WITH CONTRAST
TECHNIQUE: Multidetector CT imaging of the abdomen and pelvis was performed
using the standard protocol following bolus administration of
intravenous contrast.
CONTRAST:  100mL HYQ9NZ-2MM IOPAMIDOL (HYQ9NZ-2MM) INJECTION 61%

[Series 2: axial st · axial · 0.84mm/px · z∈[+695,+1120]mm · 13 of 95 slices shown, 15 images]
[im 5/95  soft-tissue]
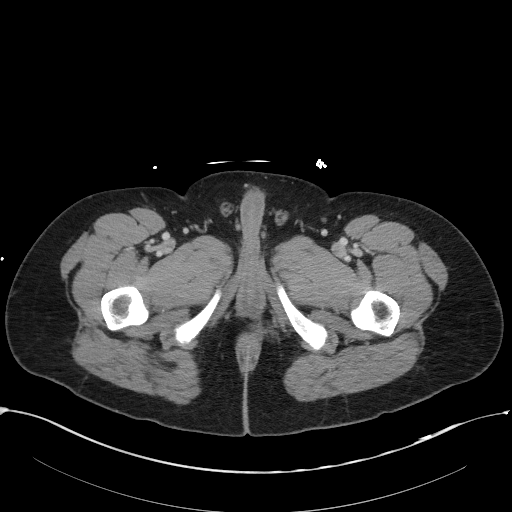
[im 5/95  bone]
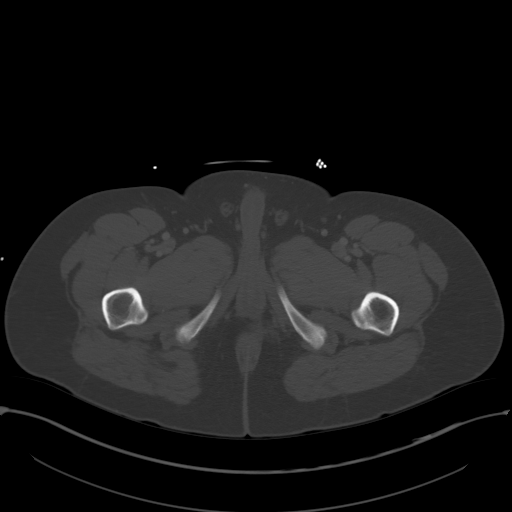
[im 13/95  soft-tissue]
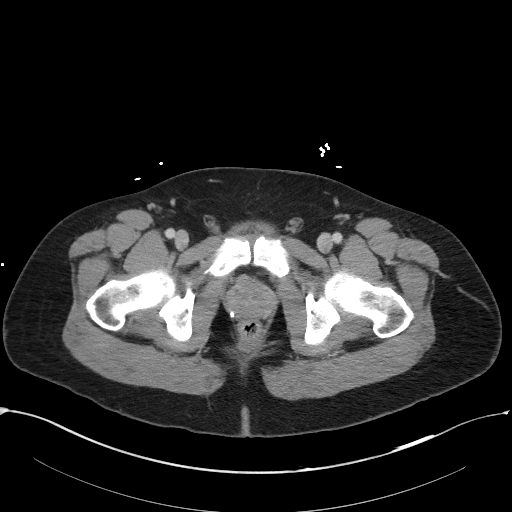
[im 21/95  soft-tissue]
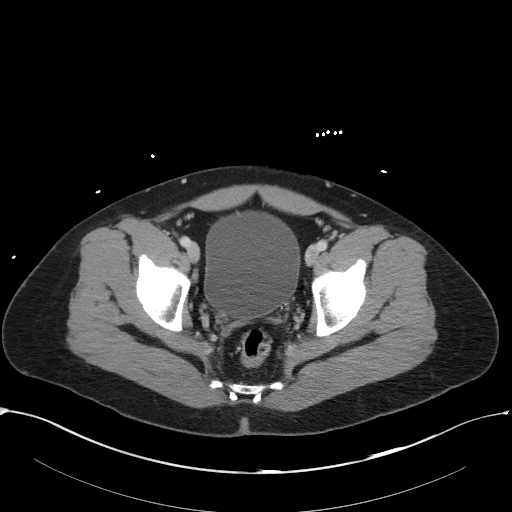
[im 25/95  soft-tissue]
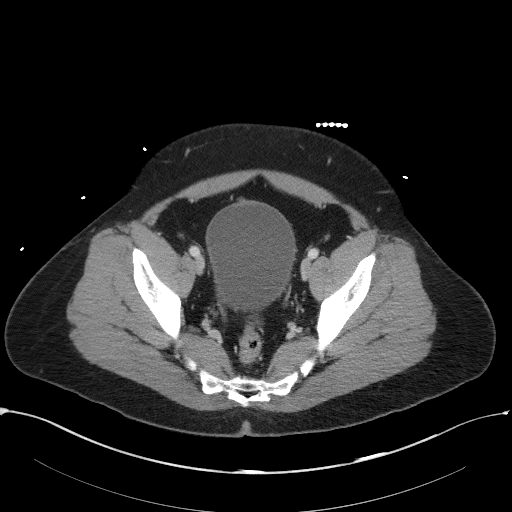
[im 33/95  soft-tissue]
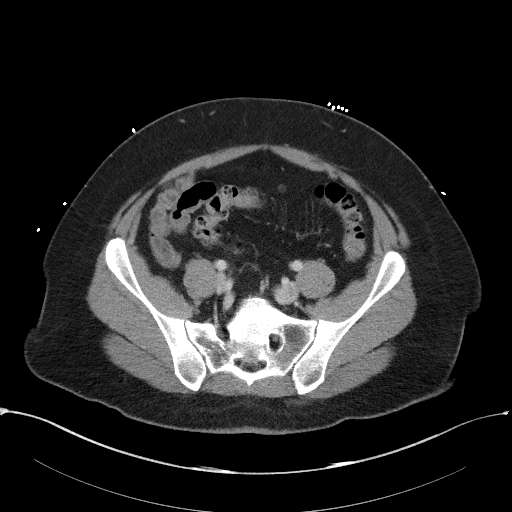
[im 41/95  soft-tissue]
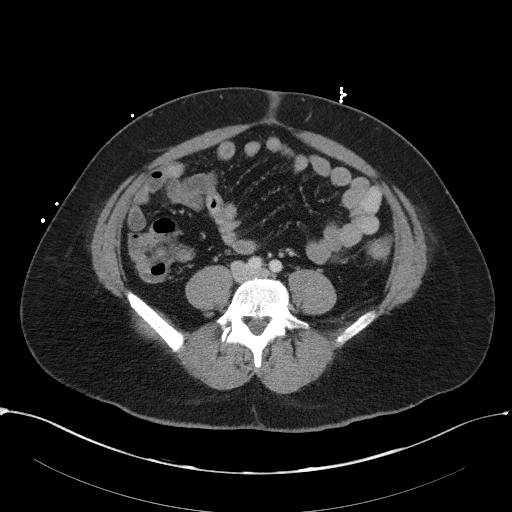
[im 50/95  soft-tissue]
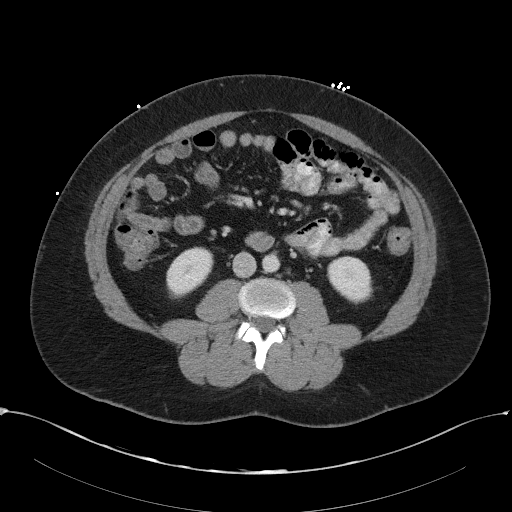
[im 54/95  soft-tissue]
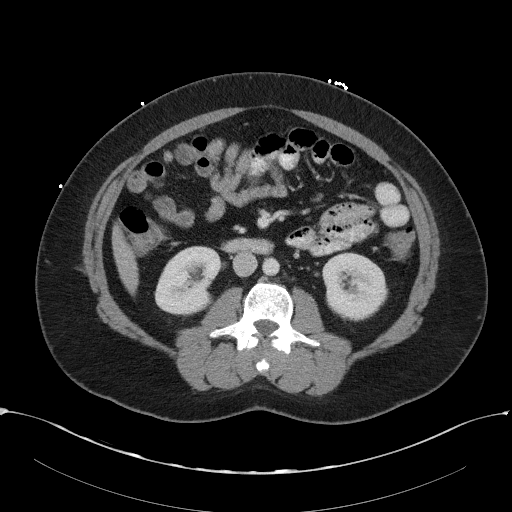
[im 62/95  soft-tissue]
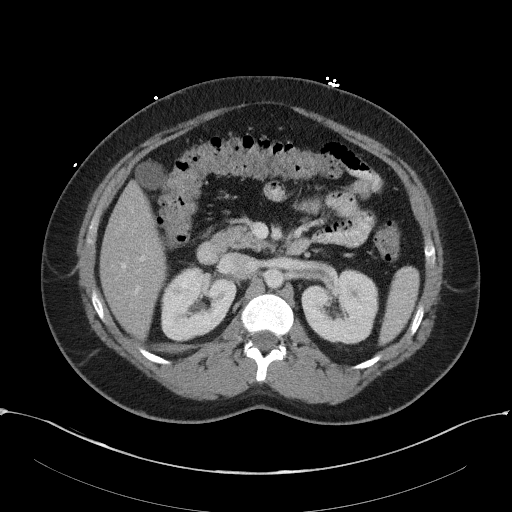
[im 62/95  bone]
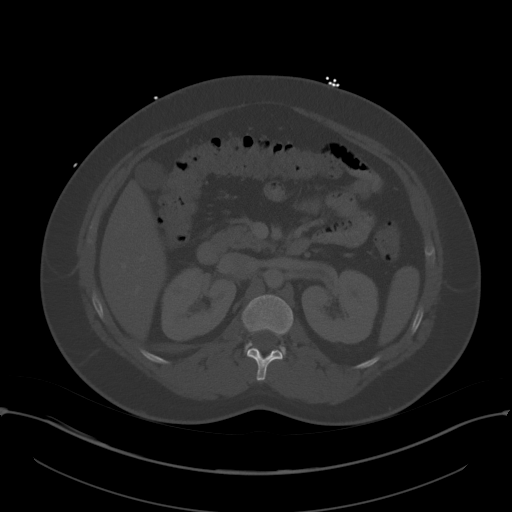
[im 70/95  soft-tissue]
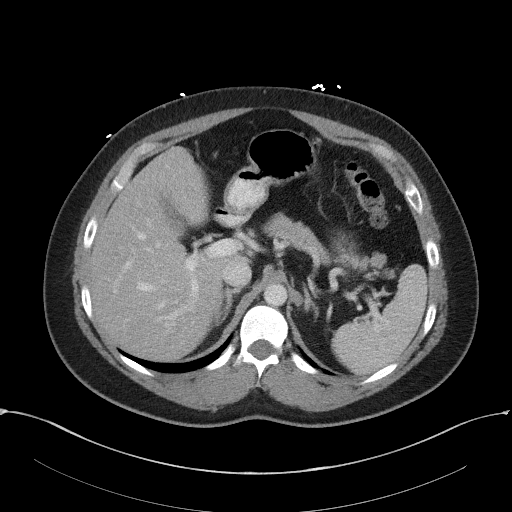
[im 74/95  soft-tissue]
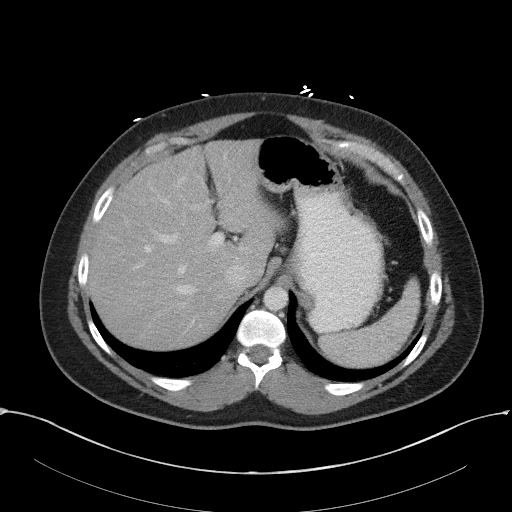
[im 82/95  soft-tissue]
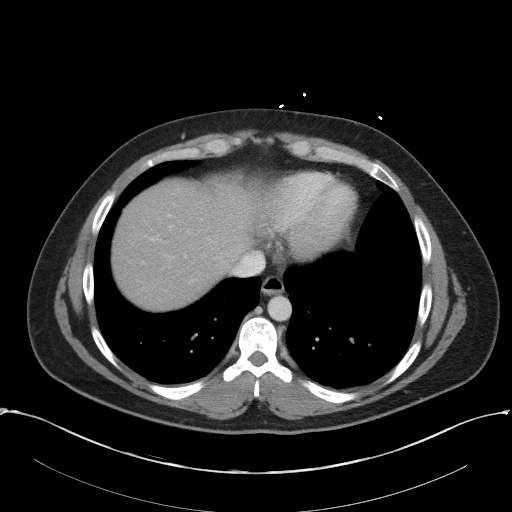
[im 90/95  soft-tissue]
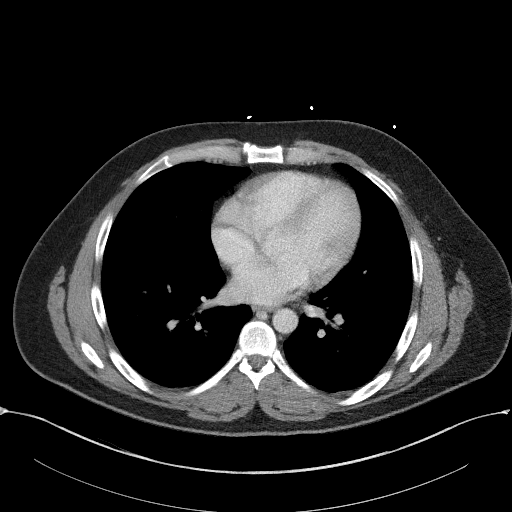

[Series 5: coronal st · coronal · 0.73mm/px · 3 of 108 slices shown]
[im 36/108  soft-tissue]
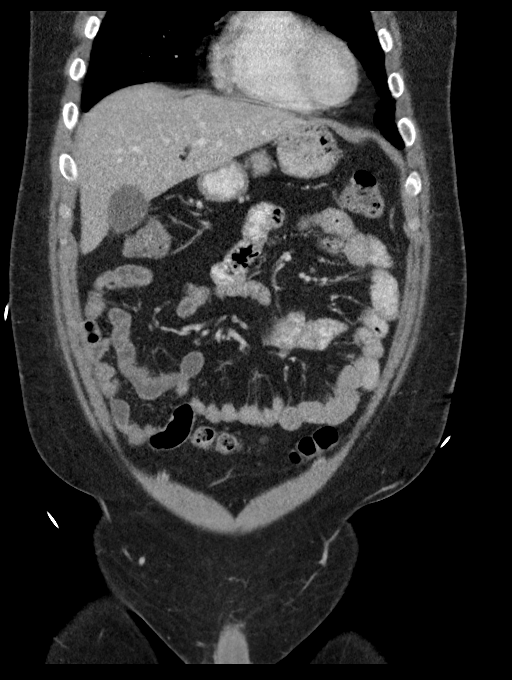
[im 48/108  soft-tissue]
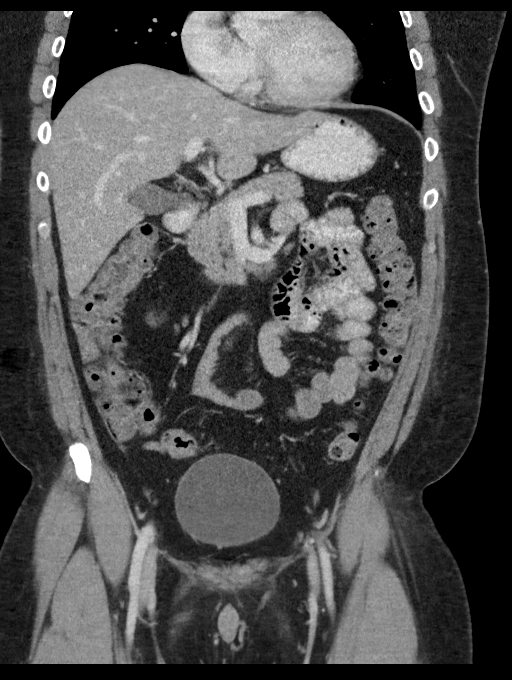
[im 60/108  soft-tissue]
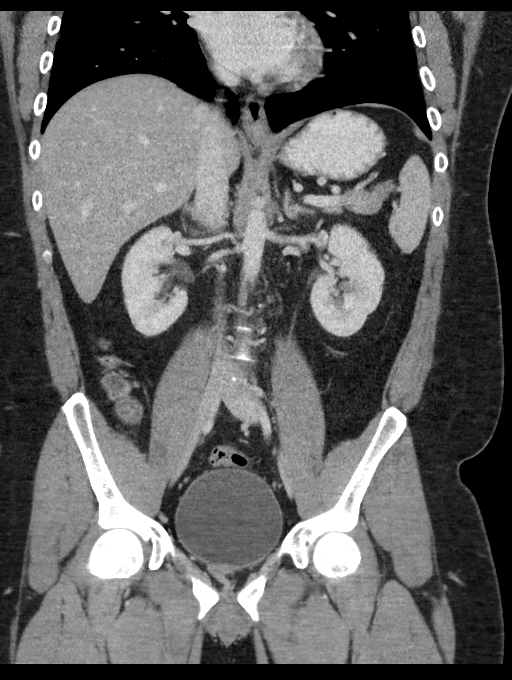

[16 of 46 positions shown; findings below may reference images not displayed]

FINDINGS: Lower chest: No acute abnormality.

Hepatobiliary: No focal liver abnormality is seen. No gallstones,
gallbladder wall thickening, or biliary dilatation.

Pancreas: Unremarkable. No pancreatic ductal dilatation or
surrounding inflammatory changes.

Spleen: Normal in size without focal abnormality.

Adrenals/Urinary Tract: Adrenal glands are unremarkable. Kidneys are
normal, without renal calculi, focal lesion, or hydronephrosis.
Bladder is unremarkable.

Stomach/Bowel: Stomach is within normal limits. Appendix is normal.
No evidence of bowel wall thickening, distention, or inflammatory
changes.

Vascular/Lymphatic: No significant vascular findings are present. No
enlarged abdominal or pelvic lymph nodes.

Reproductive: Unremarkable

Other: No acute inflammatory changes.  No ascites.

Musculoskeletal: No significant skeletal lesion.
IMPRESSION: No significant abnormality.  Normal appendix.

## 2018-06-29 ENCOUNTER — Ambulatory Visit (INDEPENDENT_AMBULATORY_CARE_PROVIDER_SITE_OTHER): Payer: 59

## 2018-06-29 DIAGNOSIS — J309 Allergic rhinitis, unspecified: Secondary | ICD-10-CM | POA: Diagnosis not present

## 2018-07-08 ENCOUNTER — Ambulatory Visit (INDEPENDENT_AMBULATORY_CARE_PROVIDER_SITE_OTHER): Payer: 59 | Admitting: *Deleted

## 2018-07-08 ENCOUNTER — Telehealth: Payer: Self-pay | Admitting: *Deleted

## 2018-07-08 DIAGNOSIS — J309 Allergic rhinitis, unspecified: Secondary | ICD-10-CM

## 2018-07-08 DIAGNOSIS — H1013 Acute atopic conjunctivitis, bilateral: Secondary | ICD-10-CM

## 2018-07-08 MED ORDER — OLOPATADINE HCL 0.7 % OP SOLN: 1.0000 [drp] | Freq: Every day | OPHTHALMIC | 3 refills | Status: DC | PRN

## 2018-07-08 MED ORDER — TRIAMCINOLONE ACETONIDE 0.1 % EX OINT: TOPICAL_OINTMENT | CUTANEOUS | 3 refills | Status: DC

## 2018-07-08 NOTE — Telephone Encounter (Signed)
Pt called and needed triamcinolone and pazeo refilled.

## 2018-07-22 ENCOUNTER — Ambulatory Visit (INDEPENDENT_AMBULATORY_CARE_PROVIDER_SITE_OTHER): Payer: 59 | Admitting: *Deleted

## 2018-07-22 DIAGNOSIS — J309 Allergic rhinitis, unspecified: Secondary | ICD-10-CM

## 2018-07-26 ENCOUNTER — Ambulatory Visit (INDEPENDENT_AMBULATORY_CARE_PROVIDER_SITE_OTHER): Payer: 59

## 2018-07-26 DIAGNOSIS — J309 Allergic rhinitis, unspecified: Secondary | ICD-10-CM

## 2018-07-27 DIAGNOSIS — Z79899 Other long term (current) drug therapy: Secondary | ICD-10-CM | POA: Diagnosis not present

## 2018-08-01 ENCOUNTER — Ambulatory Visit (INDEPENDENT_AMBULATORY_CARE_PROVIDER_SITE_OTHER): Payer: 59

## 2018-08-01 DIAGNOSIS — J309 Allergic rhinitis, unspecified: Secondary | ICD-10-CM

## 2018-08-12 ENCOUNTER — Ambulatory Visit (INDEPENDENT_AMBULATORY_CARE_PROVIDER_SITE_OTHER): Payer: 59 | Admitting: Medical

## 2018-08-12 ENCOUNTER — Encounter: Payer: Self-pay | Admitting: Medical

## 2018-08-12 VITALS — BP 125/83 | HR 103 | Temp 99.3°F | Resp 16 | Ht 63.0 in | Wt 175.2 lb

## 2018-08-12 DIAGNOSIS — R05 Cough: Secondary | ICD-10-CM

## 2018-08-12 DIAGNOSIS — R062 Wheezing: Secondary | ICD-10-CM

## 2018-08-12 DIAGNOSIS — J322 Chronic ethmoidal sinusitis: Secondary | ICD-10-CM

## 2018-08-12 DIAGNOSIS — J40 Bronchitis, not specified as acute or chronic: Secondary | ICD-10-CM

## 2018-08-12 DIAGNOSIS — J111 Influenza due to unidentified influenza virus with other respiratory manifestations: Secondary | ICD-10-CM | POA: Diagnosis not present

## 2018-08-12 DIAGNOSIS — R059 Cough, unspecified: Secondary | ICD-10-CM

## 2018-08-12 LAB — POC INFLUENZA A&B (BINAX/QUICKVUE)
INFLUENZA A, POC: POSITIVE — AB
Influenza B, POC: NEGATIVE

## 2018-08-12 MED ORDER — OSELTAMIVIR PHOSPHATE 75 MG PO CAPS: 75.0000 mg | ORAL_CAPSULE | Freq: Two times a day (BID) | ORAL | 0 refills | Status: DC

## 2018-08-12 MED ORDER — FLUTICASONE PROPIONATE 50 MCG/ACT NA SUSP: 2.0000 | Freq: Every day | NASAL | 1 refills | Status: DC

## 2018-08-12 MED ORDER — FLUTICASONE PROPIONATE HFA 110 MCG/ACT IN AERO: 2.0000 | INHALATION_SPRAY | Freq: Two times a day (BID) | RESPIRATORY_TRACT | 12 refills | Status: DC

## 2018-08-12 MED ORDER — AZITHROMYCIN 250 MG PO TABS: ORAL_TABLET | ORAL | 0 refills | Status: DC

## 2018-08-12 MED ORDER — BENZONATATE 100 MG PO CAPS: 100.0000 mg | ORAL_CAPSULE | Freq: Three times a day (TID) | ORAL | 0 refills | Status: DC | PRN

## 2018-08-12 NOTE — Patient Instructions (Addendum)
You do have type a flu presently.  I think you might still be in the treatment window timeframe.  So I prescribed Tamiflu.  Also concern for secondary sinusitis and bronchitis.  I am prescribing a azithromycin antibiotic.  For cough, I am prescribing benzonatate. For nasal congestion, I am prescribing Flonase.  For wheezing, recommend using albuterol 2 puffs every 4-6 hours as needed.  If you find that you are having to use albuterol frequently then want you to add Flovent 2 puffs twice daily.  You should progressively get better but if not please let us know.  Follow-up in 7 to 10 days or as needed.

## 2018-08-12 NOTE — Progress Notes (Signed)
Subjective:    Patient ID: Gavin Green, male    DOB: 02-18-1986, 33 y.o.   MRN: [id number]  HPI  Pt states on Tuesday he started to get little wheeze with cough. Also Tuesday some pnd. Wed about  the same. Thursday severe cough and pnd. Now a lot of productive cough. No chills, fever or bodyaches.  Pt did get flu vaccine this year.   Yesterday wheezing a lot but today breathing good.    Review of Systems  Constitutional: Positive for fatigue. Negative for chills, diaphoresis and fever.  HENT: Positive for congestion. Negative for ear pain, sinus pressure and sinus pain.   Respiratory: Positive for cough and wheezing. Negative for chest tightness and shortness of breath.   Cardiovascular: Negative for chest pain and palpitations.  Gastrointestinal: Negative for abdominal pain and blood in stool.  Musculoskeletal: Negative for back pain, joint swelling, myalgias and neck stiffness.  Skin: Negative for rash.  Neurological: Negative for dizziness and headaches.  Hematological: Negative for adenopathy. Does not bruise/bleed easily.  Psychiatric/Behavioral: Negative for behavioral problems and confusion.    Past Medical History:  Diagnosis Date  . Allergy   . Asthma   . GERD (gastroesophageal reflux disease)   . Obesity 04/27/2016     Social History   Socioeconomic History  . Marital status: Married    Spouse name: Not on file  . Number of children: Not on file  . Years of education: Not on file  . Highest education level: Not on file  Occupational History  . Not on file  Social Needs  . Financial resource strain: Not on file  . Food insecurity:    Worry: Not on file    Inability: Not on file  . Transportation needs:    Medical: Not on file    Non-medical: Not on file  Tobacco Use  . Smoking status: Never Smoker  . Smokeless tobacco: Never Used  Substance and Sexual Activity  . Alcohol use: Yes    Alcohol/week: 2.0 standard drinks    Types: 2 Cans of beer per  week  . Drug use: No  . Sexual activity: Not on file  Lifestyle  . Physical activity:    Days per week: Not on file    Minutes per session: Not on file  . Stress: Not on file  Relationships  . Social connections:    Talks on phone: Not on file    Gets together: Not on file    Attends religious service: Not on file    Active member of club or organization: Not on file    Attends meetings of clubs or organizations: Not on file    Relationship status: Not on file  . Intimate partner violence:    Fear of current or ex partner: Not on file    Emotionally abused: Not on file    Physically abused: Not on file    Forced sexual activity: Not on file  Other Topics Concern  . Not on file  Social History Narrative  . Not on file    Past Surgical History:  Procedure Laterality Date  . KNEE ARTHROSCOPY Left 2010  . REFRACTIVE SURGERY  03/26/2017    Family History  Problem Relation Age of Onset  . Allergic rhinitis Mother   . Hyperlipidemia Father   . Heart disease Father   . Hypertension Father   . Diabetes Mellitus II Father   . Hyperlipidemia Maternal Grandmother   . Hyperlipidemia Maternal Grandfather   .  Heart disease Maternal Grandfather   . Diabetes Maternal Grandfather   . Hyperlipidemia Paternal Grandmother   . Stroke Paternal Grandmother   . Diabetes Paternal Grandmother   . Hyperlipidemia Paternal Grandfather   . Heart disease Paternal Grandfather   . Pulmonary embolism Paternal Grandfather   . Diabetes Paternal Grandfather   . Asthma Paternal Aunt   . Allergic rhinitis Paternal Aunt   . Eczema Neg Hx   . Immunodeficiency Neg Hx   . Urticaria Neg Hx   . Atopy Neg Hx   . Angioedema Neg Hx     Allergies  Allergen Reactions  . Cephalosporins Rash  . Oysters [Shellfish Allergy] Other (See Comments)    Allergy    Current Outpatient Medications on File Prior to Visit  Medication Sig Dispense Refill  . albuterol (PROVENTIL) (2.5 MG/3ML) 0.083% nebulizer  solution Take 2.5 mg by nebulization every 6 (six) hours as needed for wheezing or shortness of breath.    . Albuterol Sulfate (PROAIR RESPICLICK) 108 (90 Base) MCG/ACT AEPB Inhale 2 puffs into the lungs every 4 (four) hours as needed. 1 each 2  . Azelastine HCl 0.15 % SOLN Place 1 spray into both nostrils 2 (two) times daily. 30 mL 5  . EPINEPHrine (AUVI-Q) 0.3 mg/0.3 mL IJ SOAJ injection Use as directed for severe allergic reaction 4 Device 1  . fluticasone (FLONASE) 50 MCG/ACT nasal spray Place 1 spray into both nostrils 2 (two) times daily. 16 g 5  . levocetirizine (XYZAL) 5 MG tablet Take 1 tablet (5 mg total) by mouth every evening. 30 tablet 5  . nabumetone (RELAFEN) 500 MG tablet Take 1 tablet (500 mg total) by mouth 2 (two) times daily as needed. 60 tablet 1  . Olopatadine HCl (PAZEO) 0.7 % SOLN Place 1 drop into both eyes daily as needed. 2.5 mL 3  . ranitidine (ZANTAC) 75 MG tablet Take 75 mg by mouth daily as needed for heartburn.    . triamcinolone ointment (KENALOG) 0.1 % Apply sparingly to affected areas twice daily as needed below face and neck 30 g 3   No current facility-administered medications on file prior to visit.     BP 125/83   Pulse (!) 103   Temp 99.3 F (37.4 C) (Oral)   Resp 16   Ht 5\' 3"  (1.6 m)   Wt 175 lb 3.2 oz (79.5 kg)   SpO2 99%   BMI 31.04 kg/m       Objective:   Physical Exam  General  Mental Status - Alert. General Appearance - Well groomed. Not in acute distress.  Skin Rashes- No Rashes.  HEENT Head- Normal. Ear Auditory Canal - Left- Normal. Right - Normal.Tympanic Membrane- Left- Normal. Right- Normal. Eye Sclera/Conjunctiva- Left- Normal. Right- Normal. Nose & Sinuses Nasal Mucosa- Left-  Boggy and Congested. Right-  Boggy and  Congested.Bilateral on  maxillary and no  frontal sinus pressure. Ethmoid sinus mild tender Mouth & Throat Lips: Upper Lip- Normal: no dryness, cracking, pallor, cyanosis, or vesicular eruption. Lower  Lip-Normal: no dryness, cracking, pallor, cyanosis or vesicular eruption. Buccal Mucosa- Bilateral- No Aphthous ulcers. Oropharynx- No Discharge or Erythema. Tonsils: Characteristics- Bilateral- No Erythema or Congestion. Size/Enlargement- Bilateral- No enlargement. Discharge- bilateral-None.  Neck Neck- Supple. No Masses.   Chest and Lung Exam Auscultation: Breath Sounds:-Clear even and unlabored.  Cardiovascular Auscultation:Rythm- Regular, rate and rhythm. Murmurs & Other Heart Sounds:Ausculatation of the heart reveal- No Murmurs.  Lymphatic Head & Neck General Head & Neck Lymphatics: Bilateral:  Description- No Localized lymphadenopathy.       Assessment & Plan:  You do have type a flu presently.  I think you might still be in the treatment window timeframe.  So I prescribed Tamiflu.  Also concern for secondary sinusitis and bronchitis.  I am prescribing a azithromycin antibiotic.  For cough, I am prescribing benzonatate. For nasal congestion, I am prescribing Flonase.  For wheezing, recommend using albuterol 2 puffs every 4-6 hours as needed.  If you find that you are having to use albuterol frequently then want you to add Flovent 2 puffs twice daily.  You should progressively get better but if not please let us know.  Follow-up in 7 to 10 days or as needed.  Esperanza Richters, PA-C

## 2018-08-18 ENCOUNTER — Ambulatory Visit (INDEPENDENT_AMBULATORY_CARE_PROVIDER_SITE_OTHER): Payer: 59

## 2018-08-18 DIAGNOSIS — J309 Allergic rhinitis, unspecified: Secondary | ICD-10-CM

## 2018-08-22 ENCOUNTER — Ambulatory Visit (INDEPENDENT_AMBULATORY_CARE_PROVIDER_SITE_OTHER): Payer: 59

## 2018-08-22 DIAGNOSIS — J309 Allergic rhinitis, unspecified: Secondary | ICD-10-CM

## 2018-09-07 ENCOUNTER — Ambulatory Visit (INDEPENDENT_AMBULATORY_CARE_PROVIDER_SITE_OTHER): Payer: 59 | Admitting: *Deleted

## 2018-09-07 DIAGNOSIS — J309 Allergic rhinitis, unspecified: Secondary | ICD-10-CM

## 2018-09-12 ENCOUNTER — Ambulatory Visit (INDEPENDENT_AMBULATORY_CARE_PROVIDER_SITE_OTHER): Payer: 59

## 2018-09-12 DIAGNOSIS — J309 Allergic rhinitis, unspecified: Secondary | ICD-10-CM

## 2018-09-27 ENCOUNTER — Ambulatory Visit (INDEPENDENT_AMBULATORY_CARE_PROVIDER_SITE_OTHER): Payer: 59

## 2018-09-27 DIAGNOSIS — J309 Allergic rhinitis, unspecified: Secondary | ICD-10-CM

## 2018-10-03 DIAGNOSIS — J301 Allergic rhinitis due to pollen: Secondary | ICD-10-CM

## 2018-10-03 NOTE — Progress Notes (Signed)
Vials exp 10-03-2019 

## 2018-10-18 ENCOUNTER — Ambulatory Visit (INDEPENDENT_AMBULATORY_CARE_PROVIDER_SITE_OTHER): Payer: 59

## 2018-10-18 DIAGNOSIS — J309 Allergic rhinitis, unspecified: Secondary | ICD-10-CM

## 2018-11-03 ENCOUNTER — Encounter: Payer: Self-pay | Admitting: Family Medicine

## 2018-11-08 ENCOUNTER — Ambulatory Visit (INDEPENDENT_AMBULATORY_CARE_PROVIDER_SITE_OTHER): Payer: 59

## 2018-11-08 DIAGNOSIS — J309 Allergic rhinitis, unspecified: Secondary | ICD-10-CM | POA: Diagnosis not present

## 2018-11-24 ENCOUNTER — Ambulatory Visit (INDEPENDENT_AMBULATORY_CARE_PROVIDER_SITE_OTHER): Payer: 59

## 2018-11-24 DIAGNOSIS — J309 Allergic rhinitis, unspecified: Secondary | ICD-10-CM

## 2018-12-06 ENCOUNTER — Ambulatory Visit (INDEPENDENT_AMBULATORY_CARE_PROVIDER_SITE_OTHER): Payer: 59

## 2018-12-06 DIAGNOSIS — J309 Allergic rhinitis, unspecified: Secondary | ICD-10-CM

## 2018-12-20 ENCOUNTER — Ambulatory Visit (INDEPENDENT_AMBULATORY_CARE_PROVIDER_SITE_OTHER): Payer: 59

## 2018-12-20 DIAGNOSIS — J309 Allergic rhinitis, unspecified: Secondary | ICD-10-CM | POA: Diagnosis not present

## 2018-12-30 ENCOUNTER — Ambulatory Visit (INDEPENDENT_AMBULATORY_CARE_PROVIDER_SITE_OTHER): Payer: 59

## 2018-12-30 DIAGNOSIS — J309 Allergic rhinitis, unspecified: Secondary | ICD-10-CM

## 2019-01-04 ENCOUNTER — Ambulatory Visit (INDEPENDENT_AMBULATORY_CARE_PROVIDER_SITE_OTHER): Payer: 59

## 2019-01-04 DIAGNOSIS — J309 Allergic rhinitis, unspecified: Secondary | ICD-10-CM

## 2019-01-12 ENCOUNTER — Ambulatory Visit (INDEPENDENT_AMBULATORY_CARE_PROVIDER_SITE_OTHER): Payer: 59

## 2019-01-12 ENCOUNTER — Telehealth: Payer: Self-pay

## 2019-01-12 DIAGNOSIS — J309 Allergic rhinitis, unspecified: Secondary | ICD-10-CM

## 2019-01-12 NOTE — Telephone Encounter (Signed)
Clobetasol is very a very potent steroid and should not be used on the face, head, neck, axillae, or groin area.

## 2019-01-12 NOTE — Telephone Encounter (Signed)
Pt was last seen 10/08/2018, he states his wife has clobetasol cream, he used some behind his ears for eczema, it helped he wants to know if he could have some prescribed for himself.

## 2019-01-12 NOTE — Telephone Encounter (Signed)
Is there any other cream/ointment he could use. He has a flare up about every 2-3 behind ears and a little on hands?

## 2019-01-12 NOTE — Telephone Encounter (Signed)
Please provide a prescription for Eucrisa (crisaborole) 2% ointment twice a day to affected areas as needed.  Please provide any appropriate rebate card. Also, it appears that it has been over a year so please have him schedule for a follow-up visit.   Thank you.

## 2019-01-13 MED ORDER — EUCRISA 2 % EX OINT: 1.0000 "application " | TOPICAL_OINTMENT | Freq: Two times a day (BID) | CUTANEOUS | 3 refills | Status: AC | PRN

## 2019-01-13 NOTE — Telephone Encounter (Signed)
PA submitted via Covermymeds for [place]. PA is pending.

## 2019-01-13 NOTE — Addendum Note (Signed)
Addended by: Katherina Right D on: 01/13/2019 09:04 AM   Modules accepted: Orders

## 2019-01-13 NOTE — Telephone Encounter (Signed)
Left a detailed message per DPR. Med sent to [facility] in [place]. He needs to schedule f/u apt.

## 2019-01-16 NOTE — Telephone Encounter (Signed)
PA approved for Eucrisa through 01/13/2020

## 2019-02-07 ENCOUNTER — Ambulatory Visit (INDEPENDENT_AMBULATORY_CARE_PROVIDER_SITE_OTHER): Payer: 59

## 2019-02-07 DIAGNOSIS — J309 Allergic rhinitis, unspecified: Secondary | ICD-10-CM | POA: Diagnosis not present

## 2019-02-24 ENCOUNTER — Telehealth: Payer: Self-pay | Admitting: *Deleted

## 2019-02-24 DIAGNOSIS — H1013 Acute atopic conjunctivitis, bilateral: Secondary | ICD-10-CM

## 2019-02-24 MED ORDER — PAZEO 0.7 % OP SOLN: 1.0000 [drp] | Freq: Every day | OPHTHALMIC | 0 refills | Status: DC | PRN

## 2019-02-24 NOTE — Telephone Encounter (Signed)
Pt called requesting eye drop refill. Scheduled apt. Gave 1 courtesy refill.

## 2019-03-02 ENCOUNTER — Other Ambulatory Visit: Payer: Self-pay

## 2019-03-02 ENCOUNTER — Ambulatory Visit (INDEPENDENT_AMBULATORY_CARE_PROVIDER_SITE_OTHER): Payer: 59 | Admitting: Family Medicine

## 2019-03-02 ENCOUNTER — Encounter: Payer: Self-pay | Admitting: Family Medicine

## 2019-03-02 VITALS — Ht 63.0 in | Wt 166.0 lb

## 2019-03-02 DIAGNOSIS — T7800XD Anaphylactic reaction due to unspecified food, subsequent encounter: Secondary | ICD-10-CM | POA: Diagnosis not present

## 2019-03-02 DIAGNOSIS — J4520 Mild intermittent asthma, uncomplicated: Secondary | ICD-10-CM | POA: Diagnosis not present

## 2019-03-02 DIAGNOSIS — J3089 Other allergic rhinitis: Secondary | ICD-10-CM | POA: Diagnosis not present

## 2019-03-02 DIAGNOSIS — H1013 Acute atopic conjunctivitis, bilateral: Secondary | ICD-10-CM | POA: Diagnosis not present

## 2019-03-02 DIAGNOSIS — L2084 Intrinsic (allergic) eczema: Secondary | ICD-10-CM

## 2019-03-02 MED ORDER — OLOPATADINE HCL 0.2 % OP SOLN: OPHTHALMIC | 5 refills | Status: AC

## 2019-03-02 MED ORDER — EPINEPHRINE 0.3 MG/0.3ML IJ SOAJ: INTRAMUSCULAR | 2 refills | Status: DC

## 2019-03-02 NOTE — Patient Instructions (Addendum)
Asthma Continue albuterol 2 puffs every 4 hours as needed If you use your albuterol more than 2 days a week, begin montelukast 10 mg once a day and call the clinic.   Allergic rhinitis Continue allergen immunotherapy and have epinephrine device available Continue Allegra 180 mg once a day as needed for a runny nose Continue azelastine 1-2 sprays in each nostril twice a day as needed for a runny nose or sneeze Continue Flonase 2 sprays in each nostril once a day as needed for a stuffy nose  Allergic conjunctivitis Begin Pataday eye drops one drop in each eye once a day as needed for red, itchy eyes  Atopic dermatitis Continue a daily moisturizing routine Continue Eucrisa twice a day as needed twice a day as needed Continue triamcinolone 0.1% ointment twie a day as needed to red, itchy areas below your face and neck  Food allergy Continue to avoid shellfish. In case of an allergic reaction, take Benadryl 50 mg every 4 hours, and if life-threatening symptoms occur, inject with AuviQ 0.3 mg.  Call the clinic if this treatment plan is not working well for you  Follow up in 6 months or sooner if needed

## 2019-03-02 NOTE — Progress Notes (Addendum)
RE: Gavin Green MRN: [id number] DOB: Nov 29, 1985 Date of Telemedicine Visit: 03/02/2019  Referring provider: Shelda Pal* Primary care provider: Shelda Pal, DO  Chief Complaint: Medication Management (Insurance not covering eye drops), Asthma (doing well under control), and Allergic Rhinitis  (eyes itchy when around mold)   Telemedicine Follow Up Visit via Telephone: I connected with Braylee Bosher for a follow up on 03/02/19 by telephone and verified that I am speaking with the correct person using two identifiers.   I discussed the limitations, risks, security and privacy concerns of performing an evaluation and management service by telephone and the availability of in person appointments. I also discussed with the patient that there may be a patient responsible charge related to this service. The patient expressed understanding and agreed to proceed.  Patient is at home  Provider is at the office.  Visit start time: 10:05 Visit end time: 10:35 Insurance consent/check in by: Jan Fireman Medical consent and medical assistant/nurse: Gerre Pebbles  History of Present Illness: He is a 33 y.o. male, who is being followed for asthma, allergic rhinitis, allergic conjunctivitis, atopic dermatitis, and food allergy to shellfish. His previous allergy office visit was on 10/07/2017 with Dr. Verlin Fester. At today's visit, he reports that his asthma has been well controlled with no shortness of breath, cough or wheeze with activity or rest. He is not taking montelukast at this time. He reports that he sues his albuterol when affected by an upper respiratory illness. Allergic rhinitis is reported as well controlled with Allegra. He reports allergen immunotherapy is going well with no adverse reactions. He reports a significant decrease in his allergic rhinitis while continuing allergen immunotherapy. He reports occasional occular pruritus, especially during rainy weather for which he uses  Pataday eye drops with relief of symptoms. He reports atopic dermatitis has been moderately well controlled with an occasional flare which occurs behind both ears. He continues a daily moisturizing cream, Eucrisa, and occasionally triamcinolone 0.1% to red and itchy areas below his face and neck. He continues to avoid shellfish with no accidental ingestion or epinephrine use since his last visit to this clinic. His current medications are listed in the chart.    Assessment and Plan: Ural is a 33 y.o. male with: Patient Instructions  Asthma Continue albuterol 2 puffs every 4 hours as needed If you use your albuterol more than 2 days a week, begin montelukast 10 mg once a day and call the clinic.   Allergic rhinitis Continue allergen immunotherapy and have epinephrine device available Continue Allegra 180 mg once a day as needed for a runny nose Continue azelastine 1-2 sprays in each nostril twice a day as needed for a runny nose or sneeze Continue Flonase 2 sprays in each nostril once a day as needed for a stuffy nose  Allergic conjunctivitis Begin Pataday eye drops one drop in each eye once a day as needed for red, itchy eyes  Atopic dermatitis Continue a daily moisturizing routine Continue Eucrisa twice a day as needed twice a day as needed Continue triamcinolone 0.1% ointment twie a day as needed to red, itchy areas below your face and neck  Food allergy Continue to avoid shellfish. In case of an allergic reaction, take Benadryl 50 mg every 4 hours, and if life-threatening symptoms occur, inject with AuviQ 0.3 mg.  Call the clinic if this treatment plan is not working well for you  Follow up in 6 months or sooner if needed   Return in about  6 months (around 09/02/2019), or if symptoms worsen or fail to improve.  Meds ordered this encounter  Medications   EPINEPHrine (AUVI-Q) 0.3 mg/0.3 mL IJ SOAJ injection    Sig: Use as directed for severe allergic reaction    Dispense:  2  each    Refill:  2   Olopatadine HCl 0.2 % SOLN    Sig: One drop each eye one a day if needed for itchy eyes.    Dispense:  2.5 mL    Refill:  5    Medication List:  Current Outpatient Medications  Medication Sig Dispense Refill   albuterol (PROVENTIL) (2.5 MG/3ML) 0.083% nebulizer solution Take 2.5 mg by nebulization every 6 (six) hours as needed for wheezing or shortness of breath.     Albuterol Sulfate (PROAIR RESPICLICK) 108 (90 Base) MCG/ACT AEPB Inhale 2 puffs into the lungs every 4 (four) hours as needed. 1 each 2   amphetamine-dextroamphetamine (ADDERALL XR) 20 MG 24 hr capsule      Crisaborole (EUCRISA) 2 % OINT Apply 1 application topically 2 (two) times daily as needed. 60 g 3   EPINEPHrine (AUVI-Q) 0.3 mg/0.3 mL IJ SOAJ injection Use as directed for severe allergic reaction 2 each 2   nabumetone (RELAFEN) 500 MG tablet Take 1 tablet (500 mg total) by mouth 2 (two) times daily as needed. 60 tablet 1   NON FORMULARY Allergen immunotherapy     ranitidine (ZANTAC) 75 MG tablet Take 75 mg by mouth daily as needed for heartburn.     triamcinolone ointment (KENALOG) 0.1 % Apply sparingly to affected areas twice daily as needed below face and neck 30 g 3   Azelastine HCl 0.15 % SOLN Place 1 spray into both nostrils 2 (two) times daily. 30 mL 5   fluticasone (FLONASE) 50 MCG/ACT nasal spray Place 2 sprays into both nostrils daily. (Patient not taking: Reported on 03/02/2019) 16 g 1   fluticasone (FLOVENT HFA) 110 MCG/ACT inhaler Inhale 2 puffs into the lungs 2 (two) times daily at 10 AM and 5 PM. (Patient not taking: Reported on 03/02/2019) 1 Inhaler 12   Olopatadine HCl 0.2 % SOLN One drop each eye one a day if needed for itchy eyes. 2.5 mL 5   No current facility-administered medications for this visit.    Allergies: Allergies  Allergen Reactions   Cephalosporins Rash   Oysters [Shellfish Allergy] Other (See Comments)    Allergy   I reviewed his past medical  history, social history, family history, and environmental history and no significant changes have been reported from previous visit on 10/15/2017.  Objective: Physical Exam Not obtained as encounter was done via telephone.   Previous notes and tests were reviewed.  I discussed the assessment and treatment plan with the patient. The patient was provided an opportunity to ask questions and all were answered. The patient agreed with the plan and demonstrated an understanding of the instructions.   The patient was advised to call back or seek an in-person evaluation if the symptoms worsen or if the condition fails to improve as anticipated.  I provided 30 minutes of non-face-to-face time during this encounter.  It was my pleasure to participate in [place]'s care today. Please feel free to contact me with any questions or concerns.   Sincerely,  Thermon LeylandAnne Zahava Quant, FNP  _________________________________________________  I have provided oversight concerning Thurston Holenne Amb's evaluation and treatment of this patient's health issues addressed during today's encounter.  I agree with the assessment and therapeutic  plan as outlined in the note.   Signed,   R Jorene Guestarter Bobbitt, MD

## 2019-03-10 ENCOUNTER — Ambulatory Visit (INDEPENDENT_AMBULATORY_CARE_PROVIDER_SITE_OTHER): Payer: 59

## 2019-03-10 DIAGNOSIS — J309 Allergic rhinitis, unspecified: Secondary | ICD-10-CM | POA: Diagnosis not present

## 2019-04-04 ENCOUNTER — Ambulatory Visit (INDEPENDENT_AMBULATORY_CARE_PROVIDER_SITE_OTHER): Payer: 59

## 2019-04-04 DIAGNOSIS — J309 Allergic rhinitis, unspecified: Secondary | ICD-10-CM

## 2019-05-01 ENCOUNTER — Ambulatory Visit (INDEPENDENT_AMBULATORY_CARE_PROVIDER_SITE_OTHER): Payer: 59

## 2019-05-01 DIAGNOSIS — J309 Allergic rhinitis, unspecified: Secondary | ICD-10-CM

## 2019-05-02 NOTE — Progress Notes (Signed)
Exp 05/04/20 

## 2019-05-08 ENCOUNTER — Encounter: Payer: Self-pay | Admitting: Family Medicine

## 2019-05-08 ENCOUNTER — Ambulatory Visit (INDEPENDENT_AMBULATORY_CARE_PROVIDER_SITE_OTHER): Payer: 59 | Admitting: Family Medicine

## 2019-05-08 ENCOUNTER — Other Ambulatory Visit: Payer: Self-pay

## 2019-05-08 VITALS — BP 120/88 | HR 86 | Temp 98.2°F | Ht 63.0 in | Wt 167.1 lb

## 2019-05-08 DIAGNOSIS — G4733 Obstructive sleep apnea (adult) (pediatric): Secondary | ICD-10-CM | POA: Diagnosis not present

## 2019-05-08 DIAGNOSIS — Z0000 Encounter for general adult medical examination without abnormal findings: Secondary | ICD-10-CM | POA: Diagnosis not present

## 2019-05-08 DIAGNOSIS — Z9989 Dependence on other enabling machines and devices: Secondary | ICD-10-CM

## 2019-05-08 DIAGNOSIS — J301 Allergic rhinitis due to pollen: Secondary | ICD-10-CM | POA: Diagnosis not present

## 2019-05-08 NOTE — Progress Notes (Signed)
Chief Complaint  Patient presents with  . Annual Exam    Well Male Gavin Green is here for a complete physical.   His last physical was >1 year ago.  Current diet: in general, a "healthy" diet.   Current exercise: standing desk, active around house Weight trend: stable Daytime fatigue? No. Seat belt? Yes.    Health maintenance Tetanus- Yes HIV- Yes  Past Medical History:  Diagnosis Date  . Allergy   . Asthma   . GERD (gastroesophageal reflux disease)   . Obesity 04/27/2016  . OSA on CPAP      Past Surgical History:  Procedure Laterality Date  . KNEE ARTHROSCOPY Left 2010  . REFRACTIVE SURGERY  03/26/2017    Medications  Current Outpatient Medications on File Prior to Visit  Medication Sig Dispense Refill  . Crisaborole (EUCRISA) 2 % OINT Apply 1 application topically 2 (two) times daily as needed. 60 g 3  . lisdexamfetamine (VYVANSE) 40 MG capsule Take 40 mg by mouth every morning.    . Olopatadine HCl 0.2 % SOLN One drop each eye one a day if needed for itchy eyes. 2.5 mL 5  . triamcinolone ointment (KENALOG) 0.1 % Apply sparingly to affected areas twice daily as needed below face and neck 30 g 3  . albuterol (PROVENTIL) (2.5 MG/3ML) 0.083% nebulizer solution Take 2.5 mg by nebulization every 6 (six) hours as needed for wheezing or shortness of breath.    . Albuterol Sulfate (PROAIR RESPICLICK) 341 (90 Base) MCG/ACT AEPB Inhale 2 puffs into the lungs every 4 (four) hours as needed. (Patient not taking: Reported on 05/08/2019) 1 each 2  . EPINEPHrine (AUVI-Q) 0.3 mg/0.3 mL IJ SOAJ injection Use as directed for severe allergic reaction (Patient not taking: Reported on 05/08/2019) 2 each 2  . nabumetone (RELAFEN) 500 MG tablet Take 1 tablet (500 mg total) by mouth 2 (two) times daily as needed. (Patient not taking: Reported on 05/08/2019) 60 tablet 1  . NON FORMULARY Allergen immunotherapy     Allergies Allergies  Allergen Reactions  . Cephalosporins Rash  . Oysters  [employer] Allergy] Other (See Comments)    Allergy    Family History Family History  Problem Relation Age of Onset  . Allergic rhinitis Mother   . Hyperlipidemia Father   . Heart disease Father   . Hypertension Father   . Diabetes Mellitus II Father   . Hyperlipidemia Maternal Grandmother   . Hyperlipidemia Maternal Grandfather   . Heart disease Maternal Grandfather   . Diabetes Maternal Grandfather   . Hyperlipidemia Paternal Grandmother   . Stroke Paternal Grandmother   . Diabetes Paternal Grandmother   . Hyperlipidemia Paternal Grandfather   . Heart disease Paternal Grandfather   . Pulmonary embolism Paternal Grandfather   . Diabetes Paternal Grandfather   . Asthma Paternal Aunt   . Allergic rhinitis Paternal Aunt   . Eczema Neg Hx   . Immunodeficiency Neg Hx   . Urticaria Neg Hx   . Atopy Neg Hx   . Angioedema Neg Hx     Review of Systems: Constitutional: no fevers or chills Eye:  no recent significant change in vision Ear/Nose/Mouth/Throat:  Ears:  no hearing loss Nose/Mouth/Throat:  no complaints of nasal congestion, no sore throat Cardiovascular:  no chest pain Respiratory:  no shortness of breath Gastrointestinal:  no abdominal pain, no change in bowel habits GU:  Male: negative for dysuria, frequency, and incontinence Musculoskeletal/Extremities:  no pain of the joints Integumentary (Skin/Breast):  no  abnormal skin lesions reported Neurologic:  no headaches Endocrine: No unexpected weight changes Hematologic/Lymphatic:  no night sweats  Exam BP 120/88 (BP Location: Left Arm, Patient Position: Sitting, Cuff Size: Normal)   Pulse 86   Temp 98.2 F (36.8 C) (Temporal)   Ht 5\' 3"  (1.6 m)   Wt 167 lb 2 oz (75.8 kg)   SpO2 96%   BMI 29.60 kg/m  General:  well developed, well nourished, in no apparent distress Skin:  no significant moles, warts, or growths Head:  no masses, lesions, or tenderness Eyes:  pupils equal and round, sclera anicteric without  injection Ears:  canals without lesions, TMs shiny without retraction, no obvious effusion, no erythema Nose:  nares patent, septum midline, mucosa normal Throat/Pharynx:  lips and gingiva without lesion; tongue and uvula midline; non-inflamed pharynx; no exudates or postnasal drainage Neck: neck supple without adenopathy, thyromegaly, or masses Lungs:  clear to auscultation, breath sounds equal bilaterally, no respiratory distress Cardio:  regular rate and rhythm, no bruits, no LE edema Abdomen:  abdomen soft, nontender; bowel sounds normal; no masses or organomegaly Genital (male): Uncircumcised penis, no lesions or discharge; testes present bilaterally without masses or tenderness Rectal: Deferred Musculoskeletal:  symmetrical muscle groups noted without atrophy or deformity Extremities:  no clubbing, cyanosis, or edema, no deformities, no skin discoloration Neuro:  gait normal; deep tendon reflexes normal and symmetric Psych: well oriented with normal range of affect and appropriate judgment/insight  Assessment and Plan  Well adult exam - Plan: CBC, Comprehensive metabolic panel, Lipid panel  OSA on CPAP   Well 33 y.o. male. Counseled on diet and exercise. Self testicular exams recommended at least monthly.  Other orders as above. Follow up in 1 year pending the above workup. The patient voiced understanding and agreement to the plan.  [address] [place], DO 05/08/19 3:32 PM

## 2019-05-08 NOTE — Patient Instructions (Signed)
Give us 2-3 business days to get the results of your labs back.   Keep the diet clean and stay active.  Do monthly self testicular checks in the shower. You are feeling for lumps/bumps that don't belong. If you feel anything like this, let me know!  Let us know if you need anything. 

## 2019-05-09 LAB — COMPREHENSIVE METABOLIC PANEL
ALT: 18 U/L (ref 0–53)
AST: 17 U/L (ref 0–37)
Albumin: 4.6 g/dL (ref 3.5–5.2)
Alkaline Phosphatase: 73 U/L (ref 39–117)
BUN: 10 mg/dL (ref 6–23)
CO2: 30 mEq/L (ref 19–32)
Calcium: 9.5 mg/dL (ref 8.4–10.5)
Chloride: 98 mEq/L (ref 96–112)
Creatinine, Ser: 0.72 mg/dL (ref 0.40–1.50)
GFR: 126.65 mL/min (ref >60.00)
Glucose, Bld: 91 mg/dL (ref 70–99)
Potassium: 4.1 mEq/L (ref 3.5–5.1)
Sodium: 136 mEq/L (ref 135–145)
Total Bilirubin: 0.7 mg/dL (ref 0.2–1.2)
Total Protein: 7 g/dL (ref 6.0–8.3)

## 2019-05-09 LAB — LIPID PANEL
Cholesterol: 178 mg/dL (ref 0–200)
HDL: 51.3 mg/dL (ref >39.00)
LDL Cholesterol: 91 mg/dL (ref 0–99)
NonHDL: 126.52
Total CHOL/HDL Ratio: 3
Triglycerides: 178 mg/dL — ABNORMAL HIGH (ref 0.0–149.0)
VLDL: 35.6 mg/dL (ref 0.0–40.0)

## 2019-05-09 LAB — CBC
HCT: 44.3 % (ref 39.0–52.0)
Hemoglobin: 14.7 g/dL (ref 13.0–17.0)
MCHC: 33.2 g/dL (ref 30.0–36.0)
MCV: 88 fl (ref 78.0–100.0)
Platelets: 353 10*3/uL (ref 150.0–400.0)
RBC: 5.03 Mil/uL (ref 4.22–5.81)
RDW: 13.2 % (ref 11.5–15.5)
WBC: 6.2 10*3/uL (ref 4.0–10.5)

## 2019-05-10 ENCOUNTER — Encounter: Payer: Self-pay | Admitting: *Deleted

## 2019-05-10 ENCOUNTER — Telehealth: Payer: Self-pay | Admitting: *Deleted

## 2019-05-10 DIAGNOSIS — E781 Pure hyperglyceridemia: Secondary | ICD-10-CM

## 2019-05-10 NOTE — Telephone Encounter (Signed)
See labs  mychart message sent

## 2019-05-29 ENCOUNTER — Ambulatory Visit (INDEPENDENT_AMBULATORY_CARE_PROVIDER_SITE_OTHER): Payer: 59

## 2019-05-29 DIAGNOSIS — J309 Allergic rhinitis, unspecified: Secondary | ICD-10-CM

## 2019-06-21 ENCOUNTER — Ambulatory Visit (INDEPENDENT_AMBULATORY_CARE_PROVIDER_SITE_OTHER): Payer: 59

## 2019-06-21 ENCOUNTER — Other Ambulatory Visit: Payer: 59

## 2019-06-21 DIAGNOSIS — J309 Allergic rhinitis, unspecified: Secondary | ICD-10-CM

## 2019-07-20 ENCOUNTER — Ambulatory Visit (INDEPENDENT_AMBULATORY_CARE_PROVIDER_SITE_OTHER): Payer: 59

## 2019-07-20 DIAGNOSIS — J309 Allergic rhinitis, unspecified: Secondary | ICD-10-CM

## 2019-07-26 ENCOUNTER — Ambulatory Visit (INDEPENDENT_AMBULATORY_CARE_PROVIDER_SITE_OTHER): Payer: 59

## 2019-07-26 DIAGNOSIS — J309 Allergic rhinitis, unspecified: Secondary | ICD-10-CM

## 2019-08-10 ENCOUNTER — Ambulatory Visit (INDEPENDENT_AMBULATORY_CARE_PROVIDER_SITE_OTHER): Payer: 59

## 2019-08-10 DIAGNOSIS — J309 Allergic rhinitis, unspecified: Secondary | ICD-10-CM

## 2019-08-22 ENCOUNTER — Ambulatory Visit (INDEPENDENT_AMBULATORY_CARE_PROVIDER_SITE_OTHER): Payer: 59

## 2019-08-22 DIAGNOSIS — J309 Allergic rhinitis, unspecified: Secondary | ICD-10-CM

## 2019-08-31 ENCOUNTER — Ambulatory Visit: Payer: Self-pay

## 2019-08-31 ENCOUNTER — Encounter: Payer: Self-pay | Admitting: Family Medicine

## 2019-08-31 ENCOUNTER — Ambulatory Visit (INDEPENDENT_AMBULATORY_CARE_PROVIDER_SITE_OTHER): Payer: 59 | Admitting: Family Medicine

## 2019-08-31 ENCOUNTER — Other Ambulatory Visit: Payer: Self-pay

## 2019-08-31 VITALS — BP 132/100 | HR 86 | Resp 16 | Ht 63.47 in | Wt 168.8 lb

## 2019-08-31 DIAGNOSIS — J4520 Mild intermittent asthma, uncomplicated: Secondary | ICD-10-CM

## 2019-08-31 DIAGNOSIS — J3089 Other allergic rhinitis: Secondary | ICD-10-CM | POA: Diagnosis not present

## 2019-08-31 DIAGNOSIS — H1013 Acute atopic conjunctivitis, bilateral: Secondary | ICD-10-CM

## 2019-08-31 DIAGNOSIS — L2084 Intrinsic (allergic) eczema: Secondary | ICD-10-CM | POA: Diagnosis not present

## 2019-08-31 DIAGNOSIS — J309 Allergic rhinitis, unspecified: Secondary | ICD-10-CM

## 2019-08-31 DIAGNOSIS — T7800XD Anaphylactic reaction due to unspecified food, subsequent encounter: Secondary | ICD-10-CM

## 2019-08-31 MED ORDER — PROAIR RESPICLICK 108 (90 BASE) MCG/ACT IN AEPB: 2.0000 | INHALATION_SPRAY | RESPIRATORY_TRACT | 2 refills | Status: AC | PRN

## 2019-08-31 MED ORDER — MONTELUKAST SODIUM 10 MG PO TABS: 10.0000 mg | ORAL_TABLET | Freq: Every day | ORAL | 5 refills | Status: DC

## 2019-08-31 MED ORDER — EPINEPHRINE 0.3 MG/0.3ML IJ SOAJ: INTRAMUSCULAR | 2 refills | Status: DC

## 2019-08-31 NOTE — Progress Notes (Addendum)
[address] [place] [address] Dept: [phone]  FOLLOW UP NOTE  Patient ID: Gavin Green, male    DOB: 10/27/1985  Age: 34 y.o. MRN: [id number] Date of Office Visit: 08/31/2019  Assessment  Chief Complaint: Allergic Rhinitis   HPI Gavin Green is a 34 year old male who presents to the clinic today for follow-up.  He was last seen in this clinic on 03/02/2019 for evaluation of asthma, allergic rhinitis, allergic conjunctivitis, atopic dermatitis, and food allergy to shellfish. At today's visit, he reports his asthma has been well controlled with no shortness of breath, cough, or wheeze with activity or rest. He is not currently using albuterol or taking montelukast. Allergic rhinitis is reported as well controlled with no medical intervention. He continues allergen immunotherapy with a significant reduction in his symptoms of allergic rhinitis. Allergic conjunctivitis is well controlled with Pataday as needed. Atopic dermatitis is reported as moderately well controlled with occasional red, itchy areas occurring on bilateral hands for which he uses Eucrisa with relief of symptoms. He continues to avoid molluscs with no accidental ingestion or use of his epinephrine auto-injector since his last visit to this clinic. His current medications are listed in the chart.    Drug Allergies:  Allergies  Allergen Reactions  . Cephalosporins Rash  . Oysters [Shellfish Allergy] Other (See Comments)    Allergy    Physical Exam: BP (!) 132/100   Pulse 86   Resp 16   Ht 5' 3.47" (1.612 m)   Wt 168 lb 12.8 oz (76.6 kg)   SpO2 98%   BMI 29.47 kg/m    Physical Exam Vitals reviewed.  Constitutional:      Appearance: Normal appearance.  HENT:     Head: Normocephalic and atraumatic.     Right Ear: Tympanic membrane normal.     Left Ear: Tympanic membrane normal.     Nose:     Comments: Bilateral nares normal. Pharynx normal. Ears normal. Eyes normal.    Mouth/Throat:     Pharynx:  Oropharynx is clear.  Eyes:     Conjunctiva/sclera: Conjunctivae normal.  Cardiovascular:     Rate and Rhythm: Normal rate and regular rhythm.     Heart sounds: Normal heart sounds. No murmur.  Pulmonary:     Effort: Pulmonary effort is normal.     Breath sounds: Normal breath sounds.     Comments: Lungs clear to auscultation Musculoskeletal:        General: Normal range of motion.     Cervical back: Normal range of motion and neck supple.  Skin:    General: Skin is warm and dry.     Comments: Dorsal surfaces bilateral hands with scattered red, flat dry areas. No open areas.   Neurological:     Mental Status: He is alert and oriented to person, place, and time.  Psychiatric:        Mood and Affect: Mood normal.        Behavior: Behavior normal.        Thought Content: Thought content normal.        Judgment: Judgment normal.     Diagnostics: FVC 4.39, FEV1 3.49. Predicted FVC 4.50, predicted FEV1 3.71. Spirometry indicates normal ventilatory function.    Assessment and Plan: 1. Mild intermittent asthma without complication   2. Perennial and seasonal allergic rhinitis   3. Allergic conjunctivitis of both eyes   4. Intrinsic atopic dermatitis   5. Anaphylactic shock due to food, subsequent encounter  Meds ordered this encounter  Medications  . EPINEPHrine (AUVI-Q) 0.3 mg/0.3 mL IJ SOAJ injection    Sig: Use as directed for severe allergic reaction    Dispense:  2 each    Refill:  2  . Albuterol Sulfate (PROAIR RESPICLICK) 108 (90 Base) MCG/ACT AEPB    Sig: Inhale 2 puffs into the lungs every 4 (four) hours as needed.    Dispense:  1 each    Refill:  2    KEEP ON HOLD-patient will pick up when needed  . montelukast (SINGULAIR) 10 MG tablet    Sig: Take 1 tablet (10 mg total) by mouth at bedtime.    Dispense:  30 tablet    Refill:  5    KEEP ON HOLD- patient will pick up when needed    Patient Instructions  Asthma Continue albuterol 2 puffs every 4 hours as  needed for cough or wheeze If you use your albuterol more than 2 days a week, begin montelukast 10 mg once a day and call the clinic.   Allergic rhinitis Continue allergen immunotherapy once every 3 weeks after the build up period and have epinephrine device available Continue Allegra 180 mg once a day as needed for a runny nose Continue azelastine 1-2 sprays in each nostril twice a day as needed for a runny nose or sneeze Continue Flonase 2 sprays in each nostril once a day as needed for a stuffy nose Consider saline nasal rinses as needed for nasal symptoms. Use this before any medicated nasal sprays for best result  Allergic conjunctivitis Continue Pataday eye drops one drop in each eye once a day as needed for red, itchy eyes  Atopic dermatitis Continue a daily moisturizing routine Continue Eucrisa twice a day as needed twice a day as needed  Food allergy Continue to avoid shellfish. In case of an allergic reaction, take Benadryl 50 mg every 4 hours, and if life-threatening symptoms occur, inject with AuviQ 0.3 mg.  Your blood pressure was elevated at today's visit. Follow up with your primary care provider for your blood pressure  Call the clinic if this treatment plan is not working well for you  Follow up in 6 months or sooner if needed   Return in about 6 months (around 02/28/2020), or if symptoms worsen or fail to improve.    Thank you for the opportunity to care for this patient.  Please do not hesitate to contact me with questions.  Thermon Leyland, FNP [facility] [place]  ________________________________________________  I have provided oversight concerning [facility]'s evaluation and treatment of this patient's health issues addressed during today's encounter.  I agree with the assessment and therapeutic plan as outlined in the note.   Signed,   R Jorene Guest, MD

## 2019-08-31 NOTE — Patient Instructions (Addendum)
Asthma Continue albuterol 2 puffs every 4 hours as needed for cough or wheeze If you use your albuterol more than 2 days a week, begin montelukast 10 mg once a day and call the clinic.   Allergic rhinitis Continue allergen immunotherapy once every 3 weeks after the build up period and have epinephrine device available Continue Allegra 180 mg once a day as needed for a runny nose Continue azelastine 1-2 sprays in each nostril twice a day as needed for a runny nose or sneeze Continue Flonase 2 sprays in each nostril once a day as needed for a stuffy nose Consider saline nasal rinses as needed for nasal symptoms. Use this before any medicated nasal sprays for best result  Allergic conjunctivitis Continue Pataday eye drops one drop in each eye once a day as needed for red, itchy eyes  Atopic dermatitis Continue a daily moisturizing routine Continue Eucrisa twice a day as needed twice a day as needed  Food allergy Continue to avoid shellfish. In case of an allergic reaction, take Benadryl 50 mg every 4 hours, and if life-threatening symptoms occur, inject with AuviQ 0.3 mg.  Your blood pressure was elevated at today's visit. Follow up with your primary care provider for your blood pressure  Call the clinic if this treatment plan is not working well for you  Follow up in 6 months or sooner if needed

## 2019-09-01 NOTE — Addendum Note (Signed)
Addended by: Candis Schatz C on: 09/01/2019 01:24 PM   Modules accepted: Level of Service

## 2019-09-12 ENCOUNTER — Ambulatory Visit (INDEPENDENT_AMBULATORY_CARE_PROVIDER_SITE_OTHER): Payer: 59

## 2019-09-12 DIAGNOSIS — J309 Allergic rhinitis, unspecified: Secondary | ICD-10-CM

## 2019-10-09 ENCOUNTER — Telehealth: Payer: Self-pay | Admitting: Family Medicine

## 2019-10-09 ENCOUNTER — Ambulatory Visit (INDEPENDENT_AMBULATORY_CARE_PROVIDER_SITE_OTHER): Payer: 59

## 2019-10-09 DIAGNOSIS — J309 Allergic rhinitis, unspecified: Secondary | ICD-10-CM | POA: Diagnosis not present

## 2019-10-09 NOTE — Telephone Encounter (Signed)
PT in office to let us know he is moving to [facility] in late May/June and does not want any new vials made. Current vials expire in 04/2020

## 2019-10-10 NOTE — Telephone Encounter (Signed)
Noted.  New vials will not be made.

## 2019-10-30 ENCOUNTER — Ambulatory Visit (INDEPENDENT_AMBULATORY_CARE_PROVIDER_SITE_OTHER): Payer: No Typology Code available for payment source

## 2019-10-30 DIAGNOSIS — J309 Allergic rhinitis, unspecified: Secondary | ICD-10-CM | POA: Diagnosis not present

## 2019-11-22 ENCOUNTER — Ambulatory Visit (INDEPENDENT_AMBULATORY_CARE_PROVIDER_SITE_OTHER): Payer: No Typology Code available for payment source

## 2019-11-22 ENCOUNTER — Ambulatory Visit: Payer: No Typology Code available for payment source | Admitting: Family Medicine

## 2019-11-22 ENCOUNTER — Other Ambulatory Visit: Payer: Self-pay

## 2019-11-22 ENCOUNTER — Encounter: Payer: Self-pay | Admitting: Family Medicine

## 2019-11-22 VITALS — BP 108/72 | HR 111 | Temp 96.0°F | Ht 63.5 in | Wt 172.0 lb

## 2019-11-22 DIAGNOSIS — J309 Allergic rhinitis, unspecified: Secondary | ICD-10-CM

## 2019-11-22 DIAGNOSIS — B070 Plantar wart: Secondary | ICD-10-CM

## 2019-11-22 NOTE — Patient Instructions (Addendum)
Take some aspirin and crush it up. Add some water and make a paste. Apply it nightly until they go away. Leave it on for 5-10 minutes if you can tolerate.   Filing down the dead skin prior to applying the above can be helpful. Use a pumice stone or foot file.   Let us know if you need anything.  Good luck in the future!

## 2019-11-22 NOTE — Progress Notes (Signed)
Chief Complaint  Patient presents with  . Plantar Warts    both feet    Gavin Green is a 34 y.o. male here for a skin complaint.  Duration: 3 days Location: L foot Pruritic? No Painful? No Drainage? No New soaps/lotions/topicals/detergents? No Sick contacts? No Other associated symptoms: None Therapies tried thus far: None  Past Medical History:  Diagnosis Date  . Allergy   . Asthma   . GERD (gastroesophageal reflux disease)   . Obesity 04/27/2016  . OSA on CPAP     BP 108/72 (BP Location: Left Arm, Patient Position: Sitting, Cuff Size: Normal)   Pulse (!) 111   Temp (!) 96 F (35.6 C) (Temporal)   Ht 5' 3.5" (1.613 m)   Wt 172 lb (78 kg)   SpO2 98%   BMI 29.99 kg/m  Gen: awake, alert, appearing stated age Lungs: No accessory muscle use Skin: see below. No drainage, erythema, TTP, fluctuance, excoriation Psych: Age appropriate judgment and insight    Plantar wart  ASA paste daily rec'd. Pare down the dead skin prior to application.  F/u prn. The patient voiced understanding and agreement to the plan.  Jilda Roche Norman, DO 11/22/19 1:04 PM

## 2020-05-13 ENCOUNTER — Encounter: Payer: 59 | Admitting: Family Medicine
